# Patient Record
Sex: Male | Born: 1980 | Race: White | Hispanic: No | Marital: Married | Smoking: Former smoker
Health system: Southern US, Community
[De-identification: ages and names within clinical notes are randomized; demographics above are authoritative.]

## PROBLEM LIST (undated history)

## (undated) DIAGNOSIS — I10 Essential (primary) hypertension: Secondary | ICD-10-CM

## (undated) DIAGNOSIS — T7840XA Allergy, unspecified, initial encounter: Secondary | ICD-10-CM

## (undated) DIAGNOSIS — K219 Gastro-esophageal reflux disease without esophagitis: Secondary | ICD-10-CM

## (undated) DIAGNOSIS — F329 Major depressive disorder, single episode, unspecified: Secondary | ICD-10-CM

## (undated) DIAGNOSIS — F419 Anxiety disorder, unspecified: Secondary | ICD-10-CM

## (undated) DIAGNOSIS — F32A Depression, unspecified: Secondary | ICD-10-CM

## (undated) DIAGNOSIS — R7303 Prediabetes: Secondary | ICD-10-CM

## (undated) HISTORY — DX: Prediabetes: R73.03

## (undated) HISTORY — DX: Gastro-esophageal reflux disease without esophagitis: K21.9

## (undated) HISTORY — DX: Anxiety disorder, unspecified: F41.9

## (undated) HISTORY — DX: Allergy, unspecified, initial encounter: T78.40XA

## (undated) HISTORY — DX: Depression, unspecified: F32.A

## (undated) HISTORY — DX: Essential (primary) hypertension: I10

---
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
## 2010-11-29 ENCOUNTER — Emergency Department (HOSPITAL_COMMUNITY)
Admission: EM | Admit: 2010-11-29 | Discharge: 2010-11-29 | Disposition: A | Discharge status: Home / Self Care | Payer: BC Managed Care – PPO | Attending: Emergency Medicine | Admitting: Emergency Medicine

## 2010-11-29 DIAGNOSIS — R197 Diarrhea, unspecified: Secondary | ICD-10-CM | POA: Insufficient documentation

## 2010-11-29 DIAGNOSIS — R112 Nausea with vomiting, unspecified: Secondary | ICD-10-CM | POA: Insufficient documentation

## 2010-11-29 LAB — URINALYSIS, ROUTINE W REFLEX MICROSCOPIC
Glucose, UA: NEGATIVE mg/dL
Hgb urine dipstick: NEGATIVE
Specific Gravity, Urine: 1.02 (ref 1.005–1.030)
Urobilinogen, UA: 0.2 mg/dL (ref 0.0–1.0)
pH: 6 (ref 5.0–8.0)

## 2010-11-29 LAB — BASIC METABOLIC PANEL
CO2: 24 mEq/L (ref 19–32)
Calcium: 9.2 mg/dL (ref 8.4–10.5)
Chloride: 102 mEq/L (ref 96–112)
GFR calc Af Amer: >60 mL/min (ref >60)
Potassium: 3.5 mEq/L (ref 3.5–5.1)
Sodium: 136 mEq/L (ref 135–145)

## 2010-11-29 LAB — DIFFERENTIAL
Basophils Absolute: 0 10*3/uL (ref 0.0–0.1)
Basophils Relative: 0 % (ref 0–1)
Lymphocytes Relative: 15 % (ref 12–46)
Neutro Abs: 4.7 10*3/uL (ref 1.7–7.7)
Neutrophils Relative %: 73 % (ref 43–77)

## 2010-11-29 LAB — CBC
HCT: 49.6 % (ref 39.0–52.0)
Hemoglobin: 17.4 g/dL — ABNORMAL HIGH (ref 13.0–17.0)
RBC: 5.89 MIL/uL — ABNORMAL HIGH (ref 4.22–5.81)
WBC: 6.4 10*3/uL (ref 4.0–10.5)

## 2011-04-23 ENCOUNTER — Emergency Department (HOSPITAL_COMMUNITY)
Admission: EM | Admit: 2011-04-23 | Discharge: 2011-04-23 | Disposition: A | Discharge status: Home / Self Care | Payer: BC Managed Care – PPO | Attending: Emergency Medicine | Admitting: Emergency Medicine

## 2011-04-23 ENCOUNTER — Encounter: Payer: Self-pay | Admitting: *Deleted

## 2011-04-23 ENCOUNTER — Emergency Department (HOSPITAL_COMMUNITY): Payer: BC Managed Care – PPO

## 2011-04-23 DIAGNOSIS — J40 Bronchitis, not specified as acute or chronic: Secondary | ICD-10-CM | POA: Insufficient documentation

## 2011-04-23 DIAGNOSIS — F17200 Nicotine dependence, unspecified, uncomplicated: Secondary | ICD-10-CM | POA: Insufficient documentation

## 2011-04-23 MED ORDER — ALBUTEROL SULFATE HFA 108 (90 BASE) MCG/ACT IN AERS
2.0000 | INHALATION_SPRAY | Freq: Once | RESPIRATORY_TRACT | Status: AC
  Administered 2011-04-23: 2 via RESPIRATORY_TRACT

## 2011-04-23 NOTE — ED Provider Notes (Signed)
History     Chief Complaint  Patient presents with  . Cough   Patient is a 30 y.o. male presenting with cough. The history is provided by the patient. No language interpreter was used.  Cough This is a new problem. Episode onset: 3 days ago. The problem occurs constantly. The problem has not changed since onset.The cough is non-productive. There has been no fever. Associated symptoms include headaches and rhinorrhea. Pertinent negatives include no chest pain, no chills, no sore throat and no shortness of breath. He has tried nothing for the symptoms. He is a smoker.   C/o non-productive cough with associated HA, nasal/chest congestion and rhinorrhea onset 3 days ago and persistent since. Denies fever, hemoptysis, sore throat, chest pain, abdominal pain, SOB. Patient reports he is a current everyday smoker.  Patient seen at 9:45 AM   History reviewed. No pertinent past medical history.  History reviewed. No pertinent past surgical history.  No family history on file.  History  Substance Use Topics  . Smoking status: Current Everyday Smoker  . Smokeless tobacco: Not on file  . Alcohol Use: Yes     OCc      Review of Systems  Constitutional: Negative for fever and chills.  HENT: Positive for congestion and rhinorrhea. Negative for sore throat.   Respiratory: Positive for cough. Negative for shortness of breath.   Cardiovascular: Negative for chest pain.  Gastrointestinal: Negative for abdominal pain.  Skin: Negative for rash.  Neurological: Positive for headaches.  All other systems reviewed and are negative.   All other systems negative except as noted in HPI.   Physical Exam  BP 150/95  Pulse 89  Temp(Src) 98.9 F (37.2 C) (Oral)  Resp 16  Ht 5\' 10"  (1.778 m)  Wt 215 lb (97.523 kg)  BMI 30.85 kg/m2  SpO2 98%  Physical Exam CONSTITUTIONAL: Well developed/well nourished, hypertensive HEAD AND FACE: Normocephalic/atraumatic EYES: EOMI/PERRL ENMT: Mucous  membranes moist NECK: supple no meningeal signs CV: S1/S2 noted, no murmurs/rubs/gallops noted LUNGS: no apparent distress, scattered wheezes bialterally ABDOMEN: soft, nontender, no rebound or guarding NEURO: Pt is awake/alert, moves all extremitiesx4 EXTREMITIES: pulses normal, full ROM, DP and PT pulses intact SKIN: warm, color normal PSYCH: no abnormalities of mood noted  ED Course  Procedures 9:46 AM-Patient informed of intent to d/c Explained reasons to return to ED for evaluation in association with current complaint. MDM Nursing notes reviewed and considered in documentation xrays reviewed and considered All vitals reviewed and considered  Advised pt to quit smoking  Dg Chest 2 View  04/23/2011  *RADIOLOGY REPORT*  Clinical Data: Cough.  Congestion.  Smoking history.  CHEST - 2 VIEW  Comparison: None  Findings: Heart size is normal.  Mediastinal shadows are normal. The lungs are clear.  No infiltrate, collapse or effusion.  No bony abnormality.  IMPRESSION: Normal chest  Original Report Authenticated By: Thomasenia Sales, M.D.    Chart written by Clarita Crane acting as scribe for Joya Gaskins, MD  I personally performed the services described in this documentation, which was scribed in my presence. The recorded information has been reviewed and considered. Joya Gaskins, MD    Joya Gaskins, MD 04/23/11 [phone]

## 2011-04-23 NOTE — ED Notes (Signed)
Non-productive cough with congestion x 3 days. NAD at this time.

## 2011-04-23 NOTE — ED Notes (Signed)
Pt resting. Nad. Awaiting edp eval. Water given.

## 2011-04-23 NOTE — ED Notes (Signed)
edp in with pt now 

## 2014-12-03 ENCOUNTER — Encounter (HOSPITAL_COMMUNITY): Payer: Self-pay | Admitting: Emergency Medicine

## 2014-12-03 ENCOUNTER — Emergency Department (HOSPITAL_COMMUNITY)
Admission: EM | Admit: 2014-12-03 | Discharge: 2014-12-03 | Disposition: A | Discharge status: Home / Self Care | Payer: BLUE CROSS/BLUE SHIELD | Attending: Emergency Medicine | Admitting: Emergency Medicine

## 2014-12-03 DIAGNOSIS — R112 Nausea with vomiting, unspecified: Secondary | ICD-10-CM | POA: Diagnosis not present

## 2014-12-03 DIAGNOSIS — Z720 Tobacco use: Secondary | ICD-10-CM | POA: Diagnosis not present

## 2014-12-03 DIAGNOSIS — R1010 Upper abdominal pain, unspecified: Secondary | ICD-10-CM | POA: Diagnosis present

## 2014-12-03 DIAGNOSIS — R1013 Epigastric pain: Secondary | ICD-10-CM | POA: Diagnosis not present

## 2014-12-03 LAB — COMPREHENSIVE METABOLIC PANEL
ALT: 50 U/L (ref 0–53)
AST: 37 U/L (ref 0–37)
Albumin: 4.7 g/dL (ref 3.5–5.2)
Alkaline Phosphatase: 96 U/L (ref 39–117)
Anion gap: 11 (ref 5–15)
BUN: 15 mg/dL (ref 6–23)
CALCIUM: 10 mg/dL (ref 8.4–10.5)
CO2: 25 mmol/L (ref 19–32)
Chloride: 103 mmol/L (ref 96–112)
Creatinine, Ser: 1.05 mg/dL (ref 0.50–1.35)
GFR calc non Af Amer: >90 mL/min (ref >90)
GLUCOSE: 96 mg/dL (ref 70–99)
Potassium: 4.2 mmol/L (ref 3.5–5.1)
SODIUM: 139 mmol/L (ref 135–145)
TOTAL PROTEIN: 7.9 g/dL (ref 6.0–8.3)
Total Bilirubin: 0.6 mg/dL (ref 0.3–1.2)

## 2014-12-03 LAB — CBC WITH DIFFERENTIAL/PLATELET
BASOS ABS: 0.1 10*3/uL (ref 0.0–0.1)
BASOS PCT: 1 % (ref 0–1)
Eosinophils Absolute: 0.2 10*3/uL (ref 0.0–0.7)
Eosinophils Relative: 2 % (ref 0–5)
HCT: 52.9 % — ABNORMAL HIGH (ref 39.0–52.0)
Hemoglobin: 18.6 g/dL — ABNORMAL HIGH (ref 13.0–17.0)
LYMPHS PCT: 29 % (ref 12–46)
Lymphs Abs: 3.6 10*3/uL (ref 0.7–4.0)
MCH: 32.1 pg (ref 26.0–34.0)
MCHC: 35.2 g/dL (ref 30.0–36.0)
MCV: 91.2 fL (ref 78.0–100.0)
Monocytes Absolute: 0.9 10*3/uL (ref 0.1–1.0)
Monocytes Relative: 7 % (ref 3–12)
NEUTROS ABS: 7.6 10*3/uL (ref 1.7–7.7)
NEUTROS PCT: 61 % (ref 43–77)
PLATELETS: 215 10*3/uL (ref 150–400)
RBC: 5.8 MIL/uL (ref 4.22–5.81)
RDW: 13.2 % (ref 11.5–15.5)
WBC: 12.4 10*3/uL — AB (ref 4.0–10.5)

## 2014-12-03 LAB — LIPASE, BLOOD: Lipase: 21 U/L (ref 11–59)

## 2014-12-03 MED ORDER — SODIUM CHLORIDE 0.9 % IV BOLUS (SEPSIS)
1000.0000 mL | Freq: Once | INTRAVENOUS | Status: AC
  Administered 2014-12-03: 1000 mL via INTRAVENOUS

## 2014-12-03 MED ORDER — PANTOPRAZOLE SODIUM 40 MG PO TBEC
40.0000 mg | DELAYED_RELEASE_TABLET | Freq: Every day | ORAL | Status: DC
  Administered 2014-12-03: 40 mg via ORAL
  Filled 2014-12-03: qty 1

## 2014-12-03 MED ORDER — ONDANSETRON HCL 4 MG/2ML IJ SOLN
4.0000 mg | Freq: Once | INTRAMUSCULAR | Status: AC
  Administered 2014-12-03: 4 mg via INTRAVENOUS
  Filled 2014-12-03: qty 2

## 2014-12-03 MED ORDER — HYDROMORPHONE HCL 1 MG/ML IJ SOLN
1.0000 mg | Freq: Once | INTRAMUSCULAR | Status: AC
  Administered 2014-12-03: 1 mg via INTRAVENOUS
  Filled 2014-12-03: qty 1

## 2014-12-03 MED ORDER — PANTOPRAZOLE SODIUM 40 MG PO TBEC: 40.0000 mg | DELAYED_RELEASE_TABLET | Freq: Every day | ORAL | Status: DC

## 2014-12-03 MED ORDER — SUCRALFATE 1 GM/10ML PO SUSP
1.0000 g | Freq: Three times a day (TID) | ORAL | Status: DC
  Administered 2014-12-03: 1 g via ORAL
  Filled 2014-12-03: qty 10

## 2014-12-03 MED ORDER — SUCRALFATE 1 GM/10ML PO SUSP: 1.0000 g | Freq: Three times a day (TID) | ORAL | Status: DC

## 2014-12-03 NOTE — Discharge Instructions (Signed)
As discussed, your evaluation today has been largely reassuring.  But, it is important that you monitor your condition carefully, and do not hesitate to return to the ED if you develop new, or concerning changes in your condition.  Please drink alcohol only in moderation.  Otherwise, please follow-up with your physician for appropriate ongoing care.

## 2014-12-03 NOTE — ED Notes (Signed)
Pt reports heartburn and nausea, onset last night.

## 2014-12-03 NOTE — ED Provider Notes (Signed)
CSN: [id number]     Arrival date & time 12/03/14  1635 History  This chart was scribed for Hector Munchobert Isatou Agredano, MD by Hector Jackson, ED Scribe. This patient was seen in room APA05/APA05 and the patient's care was started 6:53 PM.    Chief Complaint  Patient presents with  . Abdominal Pain   The history is provided by the patient. No language interpreter was used.   HPI Comments: Hector Jackson is a 34 y.o. male who presents to the Emergency Department complaining of intermittent, upper abdominal pain that started last night. He states that the pain occasionally radiates upwards making him have to burp or vomit. Pt reports associated nausea and vomiting. He says he has tried taking tums which failed to provide relief to his symptoms. Pt states he has had similar prior episodes but never as severe as his current episode. Pt reports he drinks about 12 beers a day. He reports a history of smoking. Pt reports no pertinent past medical history at this time. He denies fever, any urinary symptoms or any change in bowel movements.    History reviewed. No pertinent past medical history. History reviewed. No pertinent past surgical history. Family History  Problem Relation Age of Onset  . Epilepsy Other   . Diabetes Other    History  Substance Use Topics  . Smoking status: Current Every Day Smoker -- 1.50 packs/day    Types: Cigarettes  . Smokeless tobacco: Never Used  . Alcohol Use: 36.0 oz/week    60 Cans of beer per week     Comment: 8-12 beers a day    Review of Systems  Constitutional:       Per HPI, otherwise negative  HENT:       Per HPI, otherwise negative  Respiratory:       Per HPI, otherwise negative  Cardiovascular:       Per HPI, otherwise negative  Gastrointestinal: Positive for nausea, vomiting and abdominal pain.  Endocrine:       Negative aside from HPI  Genitourinary:       Neg aside from HPI   Musculoskeletal:       Per HPI, otherwise negative  Skin: Negative.    Neurological: Negative for syncope.      Allergies  Review of patient's allergies indicates no known allergies.  Home Medications   Prior to Admission medications   Medication Sig Start Date End Date Taking? Authorizing Provider  acetaminophen (TYLENOL) 500 MG tablet Take 1,000 mg by mouth every 6 (six) hours as needed. For pain     Historical Provider, MD   BP 140/94 mmHg  Pulse 91  Temp(Src) 99.2 F (37.3 C) (Oral)  Resp 16  Ht 5\' 10"  (1.778 m)  Wt 220 lb (99.791 kg)  BMI 31.57 kg/m2  SpO2 100% Physical Exam  Constitutional: He is oriented to person, place, and time. He appears well-developed. No distress.  HENT:  Head: Normocephalic and atraumatic.  Eyes: Conjunctivae and EOM are normal.  Cardiovascular: Normal rate, regular rhythm and normal heart sounds.   Pulmonary/Chest: Effort normal and breath sounds normal. No stridor. No respiratory distress. He has no wheezes. He has no rales.  Abdominal: Soft. He exhibits no distension. There is no tenderness.  Musculoskeletal: He exhibits no edema.  Neurological: He is alert and oriented to person, place, and time.  Skin: Skin is warm and dry.  Psychiatric: He has a normal mood and affect.  Nursing note and vitals reviewed.   ED  Course  Procedures   DIAGNOSTIC STUDIES: Oxygen Saturation is 100% on RA, normal by my interpretation.    COORDINATION OF CARE: 6:58 PM Discussed treatment plan with pt at bedside and pt agreed to plan.   Labs Review Labs Reviewed  CBC WITH DIFFERENTIAL/PLATELET - Abnormal; Notable for the following:    WBC 12.4 (*)    Hemoglobin 18.6 (*)    HCT 52.9 (*)    All other components within normal limits  COMPREHENSIVE METABOLIC PANEL  LIPASE, BLOOD   8:01 PM Patient in no distress.  He states he has no pain. MDM   I personally performed the services described in this documentation, which was scribed in my presence. The recorded information has been reviewed and is accurate.   Patient  presents with ongoing epigastric pain. Patient is awake, alert, with resolution of pain following Carafate, Protonix, fluids. Patient is young, has no chest pain, no lightheadedness, other stigmata of ongoing coronary ischemia. Patient has no evidence for pulmonary embolus. Patient is a soft, nontender abdomen. Patient's history, physical exam are consistent with gastritis, likely alcohol induced. Patient was provided medication, discharged in stable condition to follow-up with GI as needed.  Hector Munch, MD 12/03/14 2002

## 2018-11-08 ENCOUNTER — Ambulatory Visit: Payer: Self-pay | Admitting: Psychiatry

## 2018-11-20 ENCOUNTER — Other Ambulatory Visit: Payer: Self-pay

## 2018-11-20 ENCOUNTER — Telehealth: Payer: Self-pay | Admitting: Psychiatry

## 2018-11-20 MED ORDER — LAMOTRIGINE 150 MG PO TABS: 150.0000 mg | ORAL_TABLET | Freq: Every day | ORAL | 0 refills | Status: DC

## 2018-11-20 NOTE — Telephone Encounter (Signed)
1 month refill Lamictal 150mg  1 daily submitted to [facility] per request

## 2018-11-20 NOTE — Telephone Encounter (Signed)
Need to review paper chart not seen in epic yet  

## 2018-11-20 NOTE — Telephone Encounter (Signed)
[facility] called to request refill for Lamictal.  Please send to [facility] in [place].  Next appt is 12/04/18

## 2018-12-04 ENCOUNTER — Ambulatory Visit: Payer: Self-pay | Admitting: Psychiatry

## 2019-01-19 ENCOUNTER — Other Ambulatory Visit: Payer: Self-pay

## 2019-01-19 ENCOUNTER — Ambulatory Visit (INDEPENDENT_AMBULATORY_CARE_PROVIDER_SITE_OTHER): Payer: 59 | Admitting: Psychiatry

## 2019-01-19 DIAGNOSIS — F99 Mental disorder, not otherwise specified: Secondary | ICD-10-CM | POA: Diagnosis not present

## 2019-01-19 DIAGNOSIS — F3160 Bipolar disorder, current episode mixed, unspecified: Secondary | ICD-10-CM | POA: Diagnosis not present

## 2019-01-19 DIAGNOSIS — F5105 Insomnia due to other mental disorder: Secondary | ICD-10-CM | POA: Diagnosis not present

## 2019-01-19 MED ORDER — LAMOTRIGINE 150 MG PO TABS
150.0000 mg | ORAL_TABLET | Freq: Every day | ORAL | 0 refills | Status: DC
Start: 2019-01-19 — End: 2019-01-22

## 2019-01-19 MED ORDER — OXCARBAZEPINE 300 MG PO TABS: 900.0000 mg | ORAL_TABLET | Freq: Every day | ORAL | 2 refills | Status: DC

## 2019-01-19 MED ORDER — ARIPIPRAZOLE 10 MG PO TABS: ORAL_TABLET | ORAL | 0 refills | Status: DC

## 2019-01-19 NOTE — Progress Notes (Signed)
Hector Jackson [id number] 11/08/1980 38 y.o.  Virtual Visit via Video Note  I connected with Hector Jackson on 01/19/19 at 11:00 AM EDT by a video enabled telemedicine application and verified that I am speaking with the correct person using two identifiers.   I discussed the limitations of evaluation and management by telemedicine and the availability of in person appointments. The patient expressed understanding and agreed to proceed.   I discussed the assessment and treatment plan with the patient. The patient was provided an opportunity to ask questions and all were answered. The patient agreed with the plan and demonstrated an understanding of the instructions.   The patient was advised to call back or seek an in-person evaluation if the symptoms worsen or if the condition fails to improve as anticipated.  I provided 30 minutes of non-face-to-face time during this encounter.  The patient was located at home.  The provider was located at home.   Hector Jackson, PMHNP    Subjective:   Patient ID:  Hector Jackson is a 38 y.o. (DOB [birth year]) male.  Chief Complaint: No chief complaint on file.   HPI Hector Jackson presents for follow-up of mood and sleep disturbance. Reports that he has not been able to afford Latuda and has not taken it since October. He reports that he is staying up "all night and then I can't sleep during the day." He reports that he has had altered sleep wake cycle since January. He reports that his mood has been "ok." He reports episodic depression- "it comes and goes... sometimes I will get depressed and then I will get to doing a project." He reports that his energy is elevated, he has an increase in goal-directed activity, and decreased sleep. He reports that these episodes occur weekly and last about 3-4 days. Reports that during depressed times energy and motivation is very low. Reports eating once during the day and is getting up at night. He denies  difficulty with concentration and focus. Denies AH or VH. Reports possible occasional mild paranoia. He reports that he has had some SI since last visit, "but none recently."   Reports that he used an air purifier and it has helped with his breathing at night and is waking up less frequently during the night.   Remains out of work.   Review of Systems:  Review of Systems  Medications: I have reviewed the patient's current medications.  Current Outpatient Medications  Medication Sig Dispense Refill  . atenolol (TENORMIN) 50 MG tablet     . lamoTRIgine (LAMICTAL) 150 MG tablet Take 1 tablet (150 mg total) by mouth daily. 30 tablet 0  . Oxcarbazepine (TRILEPTAL) 300 MG tablet Take 3 tablets (900 mg total) by mouth at bedtime for 30 days. 90 tablet 2  . ARIPiprazole (ABILIFY) 10 MG tablet Take 1/2 tab po qd x 7 days, then increase to 1 tab po qd 30 tablet 0   No current facility-administered medications for this visit.     Medication Side Effects: Other: Reports mouth and lips will feel numb immediately after taking medication.   Allergies: No Known Allergies  No past medical history on file.  Family History  Problem Relation Age of Onset  . Epilepsy Other   . Diabetes Other     Social History   Socioeconomic History  . Marital status: Single    Spouse name: Not on file  . Number of children: Not on file  . Years of education: Not  on file  . Highest education level: Not on file  Occupational History  . Not on file  Social Needs  . Financial resource strain: Not on file  . Food insecurity:    Worry: Not on file    Inability: Not on file  . Transportation needs:    Medical: Not on file    Non-medical: Not on file  Tobacco Use  . Smoking status: Current Every Day Smoker    Packs/day: 1.50    Types: Cigarettes  . Smokeless tobacco: Never Used  Substance and Sexual Activity  . Alcohol use: Yes    Alcohol/week: 60.0 standard drinks    Types: 60 Cans of beer per week     Comment: 8-12 beers a day  . Drug use: No  . Sexual activity: Not on file  Lifestyle  . Physical activity:    Days per week: Not on file    Minutes per session: Not on file  . Stress: Not on file  Relationships  . Social connections:    Talks on phone: Not on file    Gets together: Not on file    Attends religious service: Not on file    Active member of club or organization: Not on file    Attends meetings of clubs or organizations: Not on file    Relationship status: Not on file  . Intimate partner violence:    Fear of current or ex partner: Not on file    Emotionally abused: Not on file    Physically abused: Not on file    Forced sexual activity: Not on file  Other Topics Concern  . Not on file  Social History Narrative  . Not on file    Past Medical History, Surgical history, Social history, and Family history were reviewed and updated as appropriate.   Please see review of systems for further details on the patient's review from today.   Objective:   Physical Exam:  There were no vitals taken for this visit.  Physical Exam Neurological:     Mental Status: He is alert and oriented to person, place, and time.     Cranial Nerves: No dysarthria.  Psychiatric:        Attention and Perception: Attention normal.        Speech: Speech normal.        Behavior: Behavior is cooperative.        Thought Content: Thought content normal. Thought content is not paranoid or delusional. Thought content does not include homicidal or suicidal ideation. Thought content does not include homicidal or suicidal plan.        Cognition and Memory: Cognition and memory normal.        Judgment: Judgment normal.     Comments: Mood presents as mildly depressed.     Lab Review:     Component Value Date/Time   NA 139 12/03/2014 1829   K 4.2 12/03/2014 1829   CL 103 12/03/2014 1829   CO2 25 12/03/2014 1829   GLUCOSE 96 12/03/2014 1829   BUN 15 12/03/2014 1829   CREATININE 1.05  12/03/2014 1829   CALCIUM 10.0 12/03/2014 1829   PROT 7.9 12/03/2014 1829   ALBUMIN 4.7 12/03/2014 1829   AST 37 12/03/2014 1829   ALT 50 12/03/2014 1829   ALKPHOS 96 12/03/2014 1829   BILITOT 0.6 12/03/2014 1829   GFRNONAA >90 12/03/2014 1829   GFRAA >90 12/03/2014 1829       Component Value Date/Time  WBC 12.4 (H) 12/03/2014 1829   RBC 5.80 12/03/2014 1829   HGB 18.6 (H) 12/03/2014 1829   HCT 52.9 (H) 12/03/2014 1829   PLT 215 12/03/2014 1829   MCV 91.2 12/03/2014 1829   MCH 32.1 12/03/2014 1829   MCHC 35.2 12/03/2014 1829   RDW 13.2 12/03/2014 1829   LYMPHSABS 3.6 12/03/2014 1829   MONOABS 0.9 12/03/2014 1829   EOSABS 0.2 12/03/2014 1829   BASOSABS 0.1 12/03/2014 1829    No results found for: POCLITH, LITHIUM   No results found for: PHENYTOIN, PHENOBARB, VALPROATE, CBMZ   .res Assessment: Plan:   Discussed treatment plan with patient and he reports that Latuda was very effective for his mood signs and symptoms and was well-tolerated, however Latuda remains cost prohibitive and patient reports contacting pharmaceutical company and learning that additional assistance is not available.  Discussed alternatives to Latuda to include Abilify since it is a generic atypical antipsychotic and may have similar response to [place] and be more affordable. Discussed potential benefits, risks, and side effects of Abilify. Discussed potential metabolic side effects associated with atypical antipsychotics, as well as potential risk for movement side effects. Advised pt to contact office if movement side effects occur.  Will start Abilify 10 mg one half tab daily for 1 week, then increase to 10 mg daily to improve depression and for mood stabilization. Continue Lamictal 150 mg daily for mood signs and symptoms. Continue Trileptal 900 mg p.o. nightly for insomnia and mood stabilization. Discussed decreasing frequency of follow-up visits due to financial constraints.  Will schedule follow-up  visit in 3 months and patient advised to call office in 4 weeks with update regarding response and tolerability of Abilify.  Discussed scheduling earlier follow-up appointment if he has significant tolerability issues or worsening in mood signs and symptoms. Patient advised to contact office with any questions, adverse effects, or acute worsening in s/s.  Bipolar affective disorder, current episode mixed, current episode severity unspecified (HCC) - Plan: ARIPiprazole (ABILIFY) 10 MG tablet, lamoTRIgine (LAMICTAL) 150 MG tablet, Oxcarbazepine (TRILEPTAL) 300 MG tablet  Insomnia due to other mental disorder  Please see After Visit Summary for patient specific instructions.  Future Appointments  Date Time Provider Department Center  04/18/2019  1:45 PM Hector Jackson, PMHNP [facility] None    No orders of the defined types were placed in this encounter.     -------------------------------

## 2019-01-21 ENCOUNTER — Encounter: Payer: Self-pay | Admitting: Psychiatry

## 2019-01-22 ENCOUNTER — Other Ambulatory Visit: Payer: Self-pay

## 2019-01-22 ENCOUNTER — Telehealth: Payer: Self-pay

## 2019-01-22 ENCOUNTER — Telehealth: Payer: Self-pay | Admitting: Psychiatry

## 2019-01-22 DIAGNOSIS — F3160 Bipolar disorder, current episode mixed, unspecified: Secondary | ICD-10-CM

## 2019-01-22 MED ORDER — LAMOTRIGINE 150 MG PO TABS: 150.0000 mg | ORAL_TABLET | Freq: Every day | ORAL | 0 refills | Status: DC

## 2019-01-22 MED ORDER — ARIPIPRAZOLE 10 MG PO TABS: ORAL_TABLET | ORAL | 0 refills | Status: DC

## 2019-01-22 MED ORDER — OXCARBAZEPINE 300 MG PO TABS: 900.0000 mg | ORAL_TABLET | Freq: Every day | ORAL | 0 refills | Status: DC

## 2019-01-22 NOTE — Telephone Encounter (Signed)
Patient's spouse Nehemiah Settle indicated medication for Lamotrigine,Oxcarbavepine, Aripirazole to be sent via mail order [facility] per insurance, pt doesn't have any on file at [facility] and the Aripirazole needs a Prior [place], [phone])

## 2019-01-22 NOTE — Telephone Encounter (Signed)
Refills submitted. PA will be submitted

## 2019-01-22 NOTE — Telephone Encounter (Signed)
Prior authorization submitted for Aripiprazole 10 mgthrough [facility] approved effective 01/16/2019-01/22/2020

## 2019-02-20 ENCOUNTER — Encounter (INDEPENDENT_AMBULATORY_CARE_PROVIDER_SITE_OTHER): Payer: Self-pay

## 2019-02-20 ENCOUNTER — Encounter: Payer: Self-pay | Admitting: General Surgery

## 2019-02-20 ENCOUNTER — Other Ambulatory Visit: Payer: Self-pay

## 2019-02-20 ENCOUNTER — Ambulatory Visit (INDEPENDENT_AMBULATORY_CARE_PROVIDER_SITE_OTHER): Payer: 59 | Admitting: General Surgery

## 2019-02-20 VITALS — BP 149/97 | HR 87 | Temp 98.4°F | Resp 18 | Ht 71.0 in | Wt 281.0 lb

## 2019-02-20 DIAGNOSIS — K429 Umbilical hernia without obstruction or gangrene: Secondary | ICD-10-CM

## 2019-02-20 NOTE — Patient Instructions (Signed)
Ventral Hernia    A ventral hernia is a bulge of tissue from inside the abdomen that pushes through a weak area of the muscles that form the front wall of the abdomen. The tissues inside the abdomen are inside a sac (peritoneum). These tissues include the small intestine, large intestine, and the fatty tissue that covers the intestines (omentum). Sometimes, the bulge that forms a hernia contains intestines. Other hernias contain only fat. Ventral hernias do not go away without surgical treatment.  There are several types of ventral hernias. You may have:   A hernia at an incision site from previous abdominal surgery (incisional hernia).   A hernia just above the belly button (epigastric hernia), or at the belly button (umbilical hernia). These types of hernias can develop from heavy lifting or straining.   A hernia that comes and goes (reducible hernia). It may be visible only when you lift or strain. This type of hernia can be pushed back into the abdomen (reduced).   A hernia that traps abdominal tissue inside the hernia (incarcerated hernia). This type of hernia does not reduce.   A hernia that cuts off blood flow to the tissues inside the hernia (strangulated hernia). The tissues can start to die if this happens. This is a very painful bulge that cannot be reduced. A strangulated hernia is a medical emergency.  What are the causes?  This condition is caused by abdominal tissue putting pressure on an area of weakness in the abdominal muscles.  What increases the risk?  The following factors may make you more likely to develop this condition:   Being male.   Being 60 or older.   Being overweight or obese.   Having had previous abdominal surgery, especially if there was an infection after surgery.   Having had an injury to the abdominal wall.   Having had several pregnancies.   Having a buildup of fluid inside the abdomen (ascites).  What are the signs or symptoms?  The only symptom of a ventral hernia  may be a painless bulge in the abdomen. A reducible hernia may be visible only when you strain, cough, or lift. Other symptoms may include:   Dull pain.   A feeling of pressure.  Signs and symptoms of a strangulated hernia may include:   Increasing pain.   Nausea and vomiting.   Pain when pressing on the hernia.   The skin over the hernia turning red or purple.   Constipation.   Blood in the stool (feces).  How is this diagnosed?  This condition may be diagnosed based on:   Your symptoms.   Your medical history.   A physical exam. You may be asked to cough or strain while standing. These actions increase the pressure inside your abdomen and force the hernia through the opening in your muscles. Your health care provider may try to reduce the hernia by pressing on it.   Imaging studies, such as an ultrasound or CT scan.  How is this treated?  This condition is treated with surgery. If you have a strangulated hernia, surgery is done as soon as possible. If your hernia is small and not incarcerated, you may be asked to lose some weight before surgery.  Follow these instructions at home:   Follow instructions from your health care provider about eating or drinking restrictions.   If you are overweight, your health care provider may recommend that you increase your activity level and eat a healthier diet.     Do not lift anything that is heavier than 10 lb (4.5 kg).   Return to your normal activities as told by your health care provider. Ask your health care provider what activities are safe for you. You may need to avoid activities that increase pressure on your hernia.   Take over-the-counter and prescription medicines only as told by your health care provider.   Keep all follow-up visits as told by your health care provider. This is important.  Contact a health care provider if:   Your hernia gets larger.   Your hernia becomes painful.  Get help right away if:   Your hernia becomes increasingly  painful.   You have pain along with any of the following:  ? Changes in skin color in the area of the hernia.  ? Nausea.  ? Vomiting.  ? Fever.  Summary   A ventral hernia is a bulge of tissue from inside the abdomen that pushes through a weak area of the muscles that form the front wall of the abdomen.   This condition is treated with surgery, which may be urgent depending on your hernia.   Do not lift anything that is heavier than 10 lb (4.5 kg), and follow activity instructions from your health care provider.  This information is not intended to replace advice given to you by your health care provider. Make sure you discuss any questions you have with your health care provider.  Document Released: 08/30/2012 Document Revised: 10/26/2017 Document Reviewed: 04/04/2017  [employer]vier Interactive Patient Education  2019 [employer]

## 2019-02-21 NOTE — Progress Notes (Signed)
Hector Jackson; [id number]; 07-30-1981   HPI Patient is a 38 year old white male who was referred to my care by Hector Jackson for evaluation treatment of an umbilical hernia.  Patient states he has had an umbilical hernia for some time now, slightly increased in size over the past few months.  He states he has intermittent upper abdominal pain when he is lifting something heavy.  It is not affected his work at this time.  He denies any nausea or vomiting.  He currently has 0 out of 10 pain. Past Medical History:  Diagnosis Date  . Allergy   . Anxiety   . Depression   . GERD (gastroesophageal reflux disease)   . Hypertension     History reviewed. No pertinent surgical history.  Family History  Problem Relation Age of Onset  . Epilepsy Other   . Diabetes Other     Current Outpatient Medications on File Prior to Visit  Medication Sig Dispense Refill  . ARIPiprazole (ABILIFY) 10 MG tablet Take 1/2 tab po qd x 7 days, then increase to 1 tab po qd 90 tablet 0  . cetirizine (ZYRTEC) 10 MG tablet Take 10 mg by mouth daily.    Marland Kitchen. lamoTRIgine (LAMICTAL) 150 MG tablet Take 1 tablet (150 mg total) by mouth daily. 90 tablet 0  . Oxcarbazepine (TRILEPTAL) 300 MG tablet Take 3 tablets (900 mg total) by mouth at bedtime. 270 tablet 0  . atenolol (TENORMIN) 50 MG tablet      No current facility-administered medications on file prior to visit.     No Known Allergies  Social History   Substance and Sexual Activity  Alcohol Use Yes  . Alcohol/week: 60.0 standard drinks  . Types: 60 Cans of beer per week   Comment: 8-12 beers a day    Social History   Tobacco Use  Smoking Status Former Smoker  . Packs/day: 1.50  . Types: Cigarettes  . Last attempt to quit: 06/22/2016  . Years since quitting: 2.6  Smokeless Tobacco Never Used    Review of Systems  Constitutional: Positive for malaise/fatigue.  HENT: Positive for sinus pain.   Eyes: Negative.   Respiratory: Negative.    Cardiovascular: Negative.   Gastrointestinal: Positive for abdominal pain and nausea.  Genitourinary: Negative.   Musculoskeletal: Positive for back pain, joint pain and neck pain.  Skin: Negative.   Neurological: Positive for dizziness.  Endo/Heme/Allergies: Negative.   Psychiatric/Behavioral: Negative.     Objective   Vitals:   02/20/19 1339  BP: (!) 149/97  Pulse: 87  Resp: 18  Temp: 98.4 F (36.9 C)  SpO2: 95%    Physical Exam Vitals signs reviewed.  Constitutional:      Appearance: Normal appearance. He is obese. He is not ill-appearing.  HENT:     Head: Normocephalic and atraumatic.  Cardiovascular:     Rate and Rhythm: Normal rate and regular rhythm.     Heart sounds: Normal heart sounds. No murmur. No friction rub. No gallop.   Pulmonary:     Effort: Pulmonary effort is normal. No respiratory distress.     Breath sounds: Normal breath sounds. No stridor. No wheezing, rhonchi or rales.  Abdominal:     General: Abdomen is flat. Bowel sounds are normal. There is no distension.     Palpations: Abdomen is soft. There is no mass.     Tenderness: There is no abdominal tenderness. There is no guarding or rebound.     Hernia: A hernia is present.  Comments: Reducible umbilical hernia.  Skin:    General: Skin is warm and dry.  Neurological:     Mental Status: He is alert and oriented to person, place, and time.   Primary care notes reviewed  Assessment  Umbilical hernia Plan   As patient is not significantly symptomatic from his umbilical hernia, he would like to delay any surgical intervention at this time which is fine with me.  Literature was given concerning incarceration.  He will follow-up if he desires repair.  The risks and benefits of the surgery including bleeding, infection, mesh use, the possibility of recurrence of the hernia were explained to the patient.

## 2019-03-10 ENCOUNTER — Other Ambulatory Visit: Payer: Self-pay | Admitting: Psychiatry

## 2019-03-10 DIAGNOSIS — F3160 Bipolar disorder, current episode mixed, unspecified: Secondary | ICD-10-CM

## 2019-04-06 ENCOUNTER — Other Ambulatory Visit: Payer: Self-pay | Admitting: Psychiatry

## 2019-04-06 DIAGNOSIS — F3160 Bipolar disorder, current episode mixed, unspecified: Secondary | ICD-10-CM

## 2019-04-09 ENCOUNTER — Other Ambulatory Visit: Payer: Self-pay | Admitting: Psychiatry

## 2019-04-09 DIAGNOSIS — F3160 Bipolar disorder, current episode mixed, unspecified: Secondary | ICD-10-CM

## 2019-04-09 NOTE — Telephone Encounter (Signed)
Pt left message need new Rx sent to [facility] for Lamictal

## 2019-04-18 ENCOUNTER — Ambulatory Visit: Payer: 59 | Admitting: Psychiatry

## 2019-05-01 ENCOUNTER — Encounter: Payer: Self-pay | Admitting: Psychiatry

## 2019-05-01 ENCOUNTER — Ambulatory Visit (INDEPENDENT_AMBULATORY_CARE_PROVIDER_SITE_OTHER): Payer: 59 | Admitting: Psychiatry

## 2019-05-01 ENCOUNTER — Other Ambulatory Visit: Payer: Self-pay

## 2019-05-01 DIAGNOSIS — F3160 Bipolar disorder, current episode mixed, unspecified: Secondary | ICD-10-CM

## 2019-05-01 MED ORDER — OXCARBAZEPINE 300 MG PO TABS: 900.0000 mg | ORAL_TABLET | Freq: Every day | ORAL | 0 refills | Status: DC

## 2019-05-01 MED ORDER — LAMOTRIGINE 150 MG PO TABS: 150.0000 mg | ORAL_TABLET | Freq: Every day | ORAL | 1 refills | Status: DC

## 2019-05-01 MED ORDER — ARIPIPRAZOLE 15 MG PO TABS: ORAL_TABLET | ORAL | 0 refills | Status: DC

## 2019-05-01 NOTE — Progress Notes (Signed)
   05/01/19 1046  Facial and Oral Movements  Muscles of Facial Expression 0  Lips and Perioral Area 0  Jaw 0  Tongue 0  Extremity Movements  Upper (arms, wrists, hands, fingers) 0  Lower (legs, knees, ankles, toes) 0  Trunk Movements  Neck, shoulders, hips 0  Overall Severity  Severity of abnormal movements (highest score from questions above) 0  Incapacitation due to abnormal movements 0  Patient's awareness of abnormal movements (rate only patient's report) 0  Dental Status  Current problems with teeth and/or dentures? No  Does patient usually wear dentures? No  AIMS Total Score  AIMS Total Score 0

## 2019-05-01 NOTE — Progress Notes (Signed)
Hector Jackson [id number] 1980-09-29 38 y.o.  Virtual Visit via Video Note  I connected with Hector Jackson on 05/01/19 at 10:30 AM EDT by a video enabled telemedicine application and verified that I am speaking with the correct person using two identifiers.  Location: Patient: Home Provider: [facility]   I discussed the limitations of evaluation and management by telemedicine and the availability of in person appointments. The patient expressed understanding and agreed to proceed.  History of Present Illness:  I discussed the assessment and treatment plan with the patient. The patient was provided an opportunity to ask questions and all were answered. The patient agreed with the plan and demonstrated an understanding of the instructions.   The patient was advised to call back or seek an in-person evaluation if the symptoms worsen or if the condition fails to improve as anticipated.  I provided 30 minutes of non-face-to-face time during this encounter.   Thayer Headings, PMHNP   Subjective:   Patient ID:  Hector Jackson is a 38 y.o. (DOB 04-30-1981) male.  Chief Complaint:  Chief Complaint  Patient presents with  . Depression  . Manic Behavior    HPI Hector Jackson presents to the office today for follow-up of mood and anxiety. He reports that he initially experienced some manic s/s with starting Abilify and had elevated mood, increased energy and goal-directed activity (building furniture and a deck). Reports some increased spending at that time on supplies for projects. Denies any other spending. Reports that he had decreased need for sleep. He reports that he was not able to start abilify for about one month due to having to change pharmacy to mail order due to cost. Reports that he also had to obtain prior authorization.   Reports that he had manic s/s for about a month. He reports that mood recently changed. He reports that his body is sore and attributes  this to increased activity. He reports that energy and motivation are now low. He reports that mood is "alright" and denies sad mood or irritability. He reports that he awakens at least 1-2 times a night. Reports that he was sleeping 5-6 hours a night previously and now is sleeping more and excessively at times. Typically eats about once a day and reports that appetite does not seem to change with mood. He reports concentration has been poor since mood has been more depressed. He reports that he tries to do things and then cannot complete it due to poor concentration. Denies anhedonia. Denies SI.   He reports that he has been having increased anxiety with situational stress with having a pool installed improperly. Reports occasional panic s/s and can focus on his breathing and prevent it from escalating to a full blown panic attack.     Review of Systems:  Review of Systems  Musculoskeletal: Negative for gait problem.  Neurological: Negative for tremors.  Psychiatric/Behavioral:       Please refer to HPI    Medications: I have reviewed the patient's current medications.  Current Outpatient Medications  Medication Sig Dispense Refill  . ARIPiprazole (ABILIFY) 15 MG tablet TAKE 1 TABLET  DAILY 90 tablet 0  . cetirizine (ZYRTEC) 10 MG tablet Take 10 mg by mouth daily.    Marland Kitchen lamoTRIgine (LAMICTAL) 150 MG tablet Take 1 tablet (150 mg total) by mouth daily. 90 tablet 1  . Multiple Vitamins-Minerals (ONE DAILY MULTIVITAMIN ADULT PO) Take by mouth.    . Oxcarbazepine (TRILEPTAL) 300 MG tablet Take  3 tablets (900 mg total) by mouth at bedtime. 270 tablet 0  . atenolol (TENORMIN) 50 MG tablet      No current facility-administered medications for this visit.     Medication Side Effects: None  Allergies: No Known Allergies  Past Medical History:  Diagnosis Date  . Allergy   . Anxiety   . Depression   . GERD (gastroesophageal reflux disease)   . Hypertension     Family History  Problem  Relation Age of Onset  . Epilepsy Other   . Diabetes Other     Social History   Socioeconomic History  . Marital status: Single    Spouse name: Not on file  . Number of children: Not on file  . Years of education: Not on file  . Highest education level: Not on file  Occupational History  . Not on file  Social Needs  . Financial resource strain: Not on file  . Food insecurity    Worry: Not on file    Inability: Not on file  . Transportation needs    Medical: Not on file    Non-medical: Not on file  Tobacco Use  . Smoking status: Former Smoker    Packs/day: 1.50    Types: Cigarettes    Quit date: 06/22/2016    Years since quitting: 2.8  . Smokeless tobacco: Never Used  Substance and Sexual Activity  . Alcohol use: Yes    Alcohol/week: 60.0 standard drinks    Types: 60 Cans of beer per week    Comment: 8-12 beers a day  . Drug use: No  . Sexual activity: Not on file  Lifestyle  . Physical activity    Days per week: Not on file    Minutes per session: Not on file  . Stress: Not on file  Relationships  . Social Musicianconnections    Talks on phone: Not on file    Gets together: Not on file    Attends religious service: Not on file    Active member of club or organization: Not on file    Attends meetings of clubs or organizations: Not on file    Relationship status: Not on file  . Intimate partner violence    Fear of current or ex partner: Not on file    Emotionally abused: Not on file    Physically abused: Not on file    Forced sexual activity: Not on file  Other Topics Concern  . Not on file  Social History Narrative  . Not on file    Past Medical History, Surgical history, Social history, and Family history were reviewed and updated as appropriate.   Please see review of systems for further details on the patient's review from today.   Objective:   Physical Exam:  There were no vitals taken for this visit.  Physical Exam Neurological:     Mental Status: He  is alert and oriented to person, place, and time.     Cranial Nerves: No dysarthria.  Psychiatric:        Attention and Perception: Attention normal.        Mood and Affect: Mood is depressed.        Speech: Speech normal.        Behavior: Behavior normal. Behavior is cooperative.        Thought Content: Thought content normal. Thought content is not paranoid or delusional. Thought content does not include homicidal or suicidal ideation. Thought content does not include homicidal  or suicidal plan.        Cognition and Memory: Cognition and memory normal.        Judgment: Judgment normal.     Lab Review:     Component Value Date/Time   NA 139 12/03/2014 1829   K 4.2 12/03/2014 1829   CL 103 12/03/2014 1829   CO2 25 12/03/2014 1829   GLUCOSE 96 12/03/2014 1829   BUN 15 12/03/2014 1829   CREATININE 1.05 12/03/2014 1829   CALCIUM 10.0 12/03/2014 1829   PROT 7.9 12/03/2014 1829   ALBUMIN 4.7 12/03/2014 1829   AST 37 12/03/2014 1829   ALT 50 12/03/2014 1829   ALKPHOS 96 12/03/2014 1829   BILITOT 0.6 12/03/2014 1829   GFRNONAA >90 12/03/2014 1829   GFRAA >90 12/03/2014 1829       Component Value Date/Time   WBC 12.4 (H) 12/03/2014 1829   RBC 5.80 12/03/2014 1829   HGB 18.6 (H) 12/03/2014 1829   HCT 52.9 (H) 12/03/2014 1829   PLT 215 12/03/2014 1829   MCV 91.2 12/03/2014 1829   MCH 32.1 12/03/2014 1829   MCHC 35.2 12/03/2014 1829   RDW 13.2 12/03/2014 1829   LYMPHSABS 3.6 12/03/2014 1829   MONOABS 0.9 12/03/2014 1829   EOSABS 0.2 12/03/2014 1829   BASOSABS 0.1 12/03/2014 1829    No results found for: POCLITH, LITHIUM   No results found for: PHENYTOIN, PHENOBARB, VALPROATE, CBMZ   .res Assessment: Plan:   Discussed potential benefits, risks, and side effects of increasing Abilify to 15 mg daily to further improve mood signs and symptoms since patient has been tolerating Abilify 10 mg daily without difficulty and this dose has been somewhat effective for his mood signs  and symptoms with less severe mood signs and symptoms and less mood lability.  Discussed that higher dose of Abilify may further improve mood symptoms since patient has had recent hypomanic signs and symptoms and is now reporting mild depressive signs and symptoms. We will continue Trileptal 900 mg at bedtime for mood stabilization and anxiety. We will continue Lamictal 150 mg daily for mood signs and symptoms. Patient to follow-up in 4 weeks or sooner if clinically indicated. Patient advised to contact office with any questions, adverse effects, or acute worsening in signs and symptoms.   Hector Jackson was seen today for depression and manic behavior.  Diagnoses and all orders for this visit:  Bipolar affective disorder, current episode mixed, current episode severity unspecified (HCC) -     ARIPiprazole (ABILIFY) 15 MG tablet; TAKE 1 TABLET  DAILY -     lamoTRIgine (LAMICTAL) 150 MG tablet; Take 1 tablet (150 mg total) by mouth daily. -     Oxcarbazepine (TRILEPTAL) 300 MG tablet; Take 3 tablets (900 mg total) by mouth at bedtime.     Please see After Visit Summary for patient specific instructions.  Future Appointments  Date Time Provider Department Center  07/02/2019  9:30 AM Corie Chiquitoarter, Travonna Swindle, PMHNP [facility] None    No orders of the defined types were placed in this encounter.   -------------------------------

## 2019-07-02 ENCOUNTER — Encounter: Payer: Self-pay | Admitting: Psychiatry

## 2019-07-02 ENCOUNTER — Ambulatory Visit (INDEPENDENT_AMBULATORY_CARE_PROVIDER_SITE_OTHER): Payer: 59 | Admitting: Psychiatry

## 2019-07-02 ENCOUNTER — Other Ambulatory Visit: Payer: Self-pay

## 2019-07-02 VITALS — Wt 300.0 lb

## 2019-07-02 DIAGNOSIS — F99 Mental disorder, not otherwise specified: Secondary | ICD-10-CM | POA: Diagnosis not present

## 2019-07-02 DIAGNOSIS — F5105 Insomnia due to other mental disorder: Secondary | ICD-10-CM

## 2019-07-02 DIAGNOSIS — F3160 Bipolar disorder, current episode mixed, unspecified: Secondary | ICD-10-CM

## 2019-07-02 NOTE — Progress Notes (Signed)
Hector Jackson [id number] 31-Jan-1981 38 y.o.  Virtual Visit via Telephone Note  I connected with pt on 07/02/19 at  9:30 AM EDT by telephone and verified that I am speaking with the correct person using two identifiers.   I discussed the limitations, risks, security and privacy concerns of performing an evaluation and management service by telephone and the availability of in person appointments. I also discussed with the patient that there may be a patient responsible charge related to this service. The patient expressed understanding and agreed to proceed.   I discussed the assessment and treatment plan with the patient. The patient was provided an opportunity to ask questions and all were answered. The patient agreed with the plan and demonstrated an understanding of the instructions.   The patient was advised to call back or seek an in-person evaluation if the symptoms worsen or if the condition fails to improve as anticipated.  I provided 25 minutes of non-face-to-face time during this encounter.  The patient was located at home.  The provider was located at [facility].   Corie Chiquito, PMHNP   Subjective:   Patient ID:  Hector Jackson is a 38 y.o. (DOB 03/22/1981) male.  Chief Complaint:  Chief Complaint  Patient presents with  . Depression  . Follow-up    h/o Mood instability and anxiety    HPI [name] presents for follow-up of mood and anxiety. He reports that his energy and motivation have been low- "just been laying around a lot." Denies depressed mood other than some situational sadness- "some days I feel kind of depressed and some days I don't." He reports that mood seems to fluctuate daily. He reports that his wife has noticed some depressive s/s. Denies irritability. Denies elevated mood. Brief periods of slightly improved energy and motivation. Denies excessive spending. He reports frequent worry and feeling nervous. He reports occ panic s/s  without progressing to a full blown panic attack.  He reports poor sleep and states he is "up and down all night long." Reports awakening every 2 hours during the night and then starts to sleep soundly in the morning. Altered sleep wake cycle and sleeping more in the day. Estimates sleeping 8-12 hours, fragmented in a 24-hour period. He reports that he has been eating more and has likely gained weight. Concentration has been poor. He reports that he will forget what he is doing in the middle of performing a task. Denies SI.  He continues to seek employment.   Has been taking Abilify in the morning or when he starts his day.   Past Psychiatric Medication Trials: Abilify- Had manic s/s at 10 mg dose. Worsening depression at 15 mg po qd Kasandra Knudsen- has been most effective med for him but current insurance offers limited coverage.  Trileptal Lamictal Buspar-Ineffective Naltrexone Trazodone  Review of Systems:  Review of Systems  Musculoskeletal: Negative for gait problem.  Neurological: Negative for tremors.  Psychiatric/Behavioral:       Please refer to HPI    Medications: I have reviewed the patient's current medications.  Current Outpatient Medications  Medication Sig Dispense Refill  . ARIPiprazole (ABILIFY) 15 MG tablet TAKE 1 TABLET  DAILY 90 tablet 0  . atenolol (TENORMIN) 50 MG tablet     . cetirizine (ZYRTEC) 10 MG tablet Take 10 mg by mouth daily.    Marland Kitchen lamoTRIgine (LAMICTAL) 150 MG tablet Take 1 tablet (150 mg total) by mouth daily. 90 tablet 1  . Multiple Vitamins-Minerals (ONE  DAILY MULTIVITAMIN ADULT PO) Take by mouth.    . Oxcarbazepine (TRILEPTAL) 300 MG tablet Take 3 tablets (900 mg total) by mouth at bedtime. 270 tablet 0   No current facility-administered medications for this visit.     Medication Side Effects: None  Allergies: No Known Allergies  Past Medical History:  Diagnosis Date  . Allergy   . Anxiety   . Depression   . GERD (gastroesophageal reflux  disease)   . Hypertension     Family History  Problem Relation Age of Onset  . Epilepsy Other   . Diabetes Other     Social History   Socioeconomic History  . Marital status: Single    Spouse name: Not on file  . Number of children: Not on file  . Years of education: Not on file  . Highest education level: Not on file  Occupational History  . Not on file  Social Needs  . Financial resource strain: Not on file  . Food insecurity    Worry: Not on file    Inability: Not on file  . Transportation needs    Medical: Not on file    Non-medical: Not on file  Tobacco Use  . Smoking status: Former Smoker    Packs/day: 1.50    Types: Cigarettes    Quit date: 06/22/2016    Years since quitting: 3.0  . Smokeless tobacco: Never Used  Substance and Sexual Activity  . Alcohol use: Yes    Alcohol/week: 60.0 standard drinks    Types: 60 Cans of beer per week    Comment: 8-12 beers a day  . Drug use: No  . Sexual activity: Not on file  Lifestyle  . Physical activity    Days per week: Not on file    Minutes per session: Not on file  . Stress: Not on file  Relationships  . Social Herbalist on phone: Not on file    Gets together: Not on file    Attends religious service: Not on file    Active member of club or organization: Not on file    Attends meetings of clubs or organizations: Not on file    Relationship status: Not on file  . Intimate partner violence    Fear of current or ex partner: Not on file    Emotionally abused: Not on file    Physically abused: Not on file    Forced sexual activity: Not on file  Other Topics Concern  . Not on file  Social History Narrative  . Not on file    Past Medical History, Surgical history, Social history, and Family history were reviewed and updated as appropriate.   Please see review of systems for further details on the patient's review from today.   Objective:   Physical Exam:  Wt 300 lb (136.1 kg)   BMI 41.84  kg/m   Physical Exam Neurological:     Mental Status: He is alert and oriented to person, place, and time.     Cranial Nerves: No dysarthria.  Psychiatric:        Attention and Perception: Attention normal.        Mood and Affect: Mood is depressed.        Speech: Speech normal.        Behavior: Behavior is cooperative.        Thought Content: Thought content normal. Thought content is not paranoid or delusional. Thought content does not include homicidal or  suicidal ideation. Thought content does not include homicidal or suicidal plan.        Cognition and Memory: Cognition and memory normal.        Judgment: Judgment normal.     Lab Review:     Component Value Date/Time   NA 139 12/03/2014 1829   K 4.2 12/03/2014 1829   CL 103 12/03/2014 1829   CO2 25 12/03/2014 1829   GLUCOSE 96 12/03/2014 1829   BUN 15 12/03/2014 1829   CREATININE 1.05 12/03/2014 1829   CALCIUM 10.0 12/03/2014 1829   PROT 7.9 12/03/2014 1829   ALBUMIN 4.7 12/03/2014 1829   AST 37 12/03/2014 1829   ALT 50 12/03/2014 1829   ALKPHOS 96 12/03/2014 1829   BILITOT 0.6 12/03/2014 1829   GFRNONAA >90 12/03/2014 1829   GFRAA >90 12/03/2014 1829       Component Value Date/Time   WBC 12.4 (H) 12/03/2014 1829   RBC 5.80 12/03/2014 1829   HGB 18.6 (H) 12/03/2014 1829   HCT 52.9 (H) 12/03/2014 1829   PLT 215 12/03/2014 1829   MCV 91.2 12/03/2014 1829   MCH 32.1 12/03/2014 1829   MCHC 35.2 12/03/2014 1829   RDW 13.2 12/03/2014 1829   LYMPHSABS 3.6 12/03/2014 1829   MONOABS 0.9 12/03/2014 1829   EOSABS 0.2 12/03/2014 1829   BASOSABS 0.1 12/03/2014 1829    No results found for: POCLITH, LITHIUM   No results found for: PHENYTOIN, PHENOBARB, VALPROATE, CBMZ   .res Assessment: Plan:   Discussed possible treatment options, to include changing Abilify to an alternative medication such as risperidone.  Discussed that Abilify may be causing some somnolence and another option would be to change administration  time of Abilify to bedtime to possibly improve sleep at night and minimize daytime somnolence.  Patient reports that he would prefer to continue Abilify at this time and change administration time.  May consider increase in Lamictal if change in administration time of Abilify does not improve depressive signs and symptoms. Patient to follow-up in approximately 2 months or sooner if clinically indicated. Patient advised to contact office with any questions, adverse effects, or acute worsening in signs and symptoms. Jill Alexanders.  Doyal was seen today for depression and follow-up.  Diagnoses and all orders for this visit:  Bipolar affective disorder, current episode mixed, current episode severity unspecified (HCC)  Insomnia due to other mental disorder    Please see After Visit Summary for patient specific instructions.  Future Appointments  Date Time Provider Department Center  09/10/2019  9:30 AM Corie Chiquitoarter, Kennya Schwenn, PMHNP [facility] None    No orders of the defined types were placed in this encounter.     -------------------------------

## 2019-07-12 ENCOUNTER — Other Ambulatory Visit: Payer: Self-pay | Admitting: Family Medicine

## 2019-07-12 ENCOUNTER — Other Ambulatory Visit: Payer: Self-pay

## 2019-07-12 ENCOUNTER — Ambulatory Visit: Payer: Self-pay

## 2019-07-12 DIAGNOSIS — Z0000 Encounter for general adult medical examination without abnormal findings: Secondary | ICD-10-CM

## 2019-08-14 ENCOUNTER — Other Ambulatory Visit: Payer: Self-pay | Admitting: Psychiatry

## 2019-08-14 DIAGNOSIS — F3160 Bipolar disorder, current episode mixed, unspecified: Secondary | ICD-10-CM

## 2019-09-05 ENCOUNTER — Telehealth: Payer: Self-pay | Admitting: Psychiatry

## 2019-09-05 NOTE — Telephone Encounter (Signed)
Hector Jackson called returning a call he had from here though He didn't know who called.  He said his insurance has changed and he now has to use [facility].  If you have received something from them, it is because scripts now need to go to them.  Though he said he didn't need any refill right now.

## 2019-09-05 NOTE — Telephone Encounter (Signed)
Noted thank you, yes received fax from [place] called to confirm with him.

## 2019-09-10 ENCOUNTER — Other Ambulatory Visit: Payer: Self-pay

## 2019-09-10 ENCOUNTER — Ambulatory Visit (INDEPENDENT_AMBULATORY_CARE_PROVIDER_SITE_OTHER): Payer: 59 | Admitting: Psychiatry

## 2019-09-10 ENCOUNTER — Encounter: Payer: Self-pay | Admitting: Psychiatry

## 2019-09-10 VITALS — Wt 300.0 lb

## 2019-09-10 DIAGNOSIS — F5105 Insomnia due to other mental disorder: Secondary | ICD-10-CM

## 2019-09-10 DIAGNOSIS — F99 Mental disorder, not otherwise specified: Secondary | ICD-10-CM

## 2019-09-10 DIAGNOSIS — F3160 Bipolar disorder, current episode mixed, unspecified: Secondary | ICD-10-CM

## 2019-09-10 MED ORDER — LURASIDONE HCL 40 MG PO TABS: ORAL_TABLET | ORAL | 0 refills | Status: DC

## 2019-09-10 MED ORDER — ARIPIPRAZOLE 15 MG PO TABS: ORAL_TABLET | ORAL | 3 refills | Status: DC

## 2019-09-10 MED ORDER — OXCARBAZEPINE 300 MG PO TABS: 900.0000 mg | ORAL_TABLET | Freq: Every day | ORAL | 0 refills | Status: DC

## 2019-09-10 MED ORDER — LAMOTRIGINE 150 MG PO TABS: 150.0000 mg | ORAL_TABLET | Freq: Every day | ORAL | 1 refills | Status: DC

## 2019-09-10 NOTE — Progress Notes (Signed)
[name] [id number] 12-24-1980 38 y.o.  Virtual Visit via Telephone Note  I connected with pt on 09/10/19 at  9:30 AM EST by telephone and verified that I am speaking with the correct person using two identifiers.   I discussed the limitations, risks, security and privacy concerns of performing an evaluation and management service by telephone and the availability of in person appointments. I also discussed with the patient that there may be a patient responsible charge related to this service. The patient expressed understanding and agreed to proceed.   I discussed the assessment and treatment plan with the patient. The patient was provided an opportunity to ask questions and all were answered. The patient agreed with the plan and demonstrated an understanding of the instructions.   The patient was advised to call back or seek an in-person evaluation if the symptoms worsen or if the condition fails to improve as anticipated.  I provided 25 minutes of non-face-to-face time during this encounter.  The patient was located at home.  The provider was located at [facility].   Hector Jackson, PMHNP   Subjective:   Patient ID:  Hector Jackson is a 38 y.o. (DOB 01-26-1981) male.  Chief Complaint:  Chief Complaint  Patient presents with  . Anxiety  . Follow-up    History of mood disturbance    HPI Hector Jackson presents for follow-up of mood and anxiety. He reports mood has been stable. Denies depressed mood or irritability. Denies any recent manic s/s to include excessive spending or increased goal-directed activity. He reports that the continues to have significant anxiety. He reports frequent worry "about everything." He reports frequent nausea with anxiety. He reports that he has had some panic s/s without any recent full blown panic attacks. He reports that he has been sleeping well and has been on a day shift and this is about to change. He reports that his energy  and motivation have been adequate. He notices mood and anxiety are better when he stays busy. He reports some difficulty with concentration at times. Denies SI.   He reports that he has a new job in [place]. He reports that job has been going well and will be going to night shift, although he would prefer day shift.  He reports that he has gained wt despite no significant change in food intake. Continues to eat only 2 meals qd without snacks.   He reports, "everything was better on Latuda." He reports that he has gained weight since starting Abilify. Weighed 250 lbs prior to starting medications. He reports that pre-employment physical indicated increased glucose and cholesterol.    Past Psychiatric Medication Trials: Abilify- Had manic s/s at 10 mg dose. Worsening depression at 15 mg po qd Anette Guarneri- has been most effective med for him but current insurance offers limited coverage.  Trileptal Lamictal Buspar-Ineffective Naltrexone Trazodone  Review of Systems:  Review of Systems  Gastrointestinal:       Reports umbilical hernia  Musculoskeletal: Negative for gait problem.  Neurological: Negative for tremors.  Psychiatric/Behavioral:       Please refer to HPI    Medications: I have reviewed the patient's current medications.  Current Outpatient Medications  Medication Sig Dispense Refill  . ARIPiprazole (ABILIFY) 15 MG tablet Stop taking when Latuda becomes available 90 tablet 3  . cetirizine (ZYRTEC) 10 MG tablet Take 10 mg by mouth daily.    Marland Kitchen lamoTRIgine (LAMICTAL) 150 MG tablet Take 1 tablet (150 mg total) by  mouth daily. 90 tablet 1  . Multiple Vitamins-Minerals (ONE DAILY MULTIVITAMIN ADULT PO) Take by mouth.    . Oxcarbazepine (TRILEPTAL) 300 MG tablet Take 3 tablets (900 mg total) by mouth at bedtime. 270 tablet 0  . atenolol (TENORMIN) 50 MG tablet     . lurasidone (LATUDA) 40 MG TABS tablet Take 1/2 tablet daily with a meal for one week, then increase to 1 tablet daily  with a meal. 90 tablet 0   No current facility-administered medications for this visit.    Medication Side Effects: Other: increased wt, glucose, and cholesterol  Allergies: No Known Allergies  Past Medical History:  Diagnosis Date  . Allergy   . Anxiety   . Depression   . GERD (gastroesophageal reflux disease)   . Hypertension     Family History  Problem Relation Age of Onset  . Epilepsy Other   . Diabetes Other     Social History   Socioeconomic History  . Marital status: Single    Spouse name: Not on file  . Number of children: Not on file  . Years of education: Not on file  . Highest education level: Not on file  Occupational History  . Not on file  Tobacco Use  . Smoking status: Former Smoker    Packs/day: 1.50    Types: Cigarettes    Quit date: 06/22/2016    Years since quitting: 3.2  . Smokeless tobacco: Never Used  Substance and Sexual Activity  . Alcohol use: Yes    Alcohol/week: 60.0 standard drinks    Types: 60 Cans of beer per week    Comment: 8-12 beers a day  . Drug use: No  . Sexual activity: Not on file  Other Topics Concern  . Not on file  Social History Narrative  . Not on file   Social Determinants of Health   Financial Resource Strain:   . Difficulty of Paying Living Expenses: Not on file  Food Insecurity:   . Worried About Programme researcher, broadcasting/film/video in the Last Year: Not on file  . Ran Out of Food in the Last Year: Not on file  Transportation Needs:   . Lack of Transportation (Medical): Not on file  . Lack of Transportation (Non-Medical): Not on file  Physical Activity:   . Days of Exercise per Week: Not on file  . Minutes of Exercise per Session: Not on file  Stress:   . Feeling of Stress : Not on file  Social Connections:   . Frequency of Communication with Friends and Family: Not on file  . Frequency of Social Gatherings with Friends and Family: Not on file  . Attends Religious Services: Not on file  . Active Member of Clubs or  Organizations: Not on file  . Attends Banker Meetings: Not on file  . Marital Status: Not on file  Intimate Partner Violence:   . Fear of Current or Ex-Partner: Not on file  . Emotionally Abused: Not on file  . Physically Abused: Not on file  . Sexually Abused: Not on file    Past Medical History, Surgical history, Social history, and Family history were reviewed and updated as appropriate.   Please see review of systems for further details on the patient's review from today.   Objective:   Physical Exam:  Wt 300 lb (136.1 kg)   BMI 41.84 kg/m   Physical Exam Neurological:     Mental Status: He is alert and oriented to person, place,  and time.     Cranial Nerves: No dysarthria.  Psychiatric:        Attention and Perception: Attention and perception normal.        Mood and Affect: Mood is anxious.        Speech: Speech normal.        Behavior: Behavior is cooperative.        Thought Content: Thought content normal. Thought content is not paranoid or delusional. Thought content does not include homicidal or suicidal ideation. Thought content does not include homicidal or suicidal plan.        Cognition and Memory: Cognition and memory normal.        Judgment: Judgment normal.     Comments: Insight intact     Lab Review:     Component Value Date/Time   NA 139 12/03/2014 1829   K 4.2 12/03/2014 1829   CL 103 12/03/2014 1829   CO2 25 12/03/2014 1829   GLUCOSE 96 12/03/2014 1829   BUN 15 12/03/2014 1829   CREATININE 1.05 12/03/2014 1829   CALCIUM 10.0 12/03/2014 1829   PROT 7.9 12/03/2014 1829   ALBUMIN 4.7 12/03/2014 1829   AST 37 12/03/2014 1829   ALT 50 12/03/2014 1829   ALKPHOS 96 12/03/2014 1829   BILITOT 0.6 12/03/2014 1829   GFRNONAA >90 12/03/2014 1829   GFRAA >90 12/03/2014 1829       Component Value Date/Time   WBC 12.4 (H) 12/03/2014 1829   RBC 5.80 12/03/2014 1829   HGB 18.6 (H) 12/03/2014 1829   HCT 52.9 (H) 12/03/2014 1829   PLT  215 12/03/2014 1829   MCV 91.2 12/03/2014 1829   MCH 32.1 12/03/2014 1829   MCHC 35.2 12/03/2014 1829   RDW 13.2 12/03/2014 1829   LYMPHSABS 3.6 12/03/2014 1829   MONOABS 0.9 12/03/2014 1829   EOSABS 0.2 12/03/2014 1829   BASOSABS 0.1 12/03/2014 1829    No results found for: POCLITH, LITHIUM   No results found for: PHENYTOIN, PHENOBARB, VALPROATE, CBMZ   .res Assessment: Plan:   Patient seen for 30 minutes and greater than 50% of visit spent counseling patient and coordinating care.  Patient ask about switching back to Jordan since he now has a new job and insurance has changed and it appears that Jordan is covered and cost through mail order would be feasible.  Patient reports that his mood and anxiety were very well controlled in the past with Latuda, however Latuda became cost prohibitive with previous insurance and patient was then tried on other medications.  He has had weight gain and increase in glucose and cholesterol with Abilify.  Also, anxiety signs and symptoms have not been as well controlled with Abilify. Will therefore transition pt back to Jordan. Reviewed potential benefits, risks, and side effects of Latuda and reminded pt to take Latuda with a full meal to improve absorption and decrease risk of side effects.  Will start Latuda 40 mg 1/2 tab po qd with a meal for one week, then increase to 1 tab po qd with a meal for mood stabilization.  Advised pt to continue taking abilify until Jordan arrives through mail order. Advised pt to stop Abilify when he starts Jordan and that Abilify has a long half-life and blood levels will gradually decrease after stopping Abilify and starting Latuda.  Discussed follow-up in 4 to 6 weeks, however patient request to extend follow-up to 3 months and reports that he will call office if he experiences any side  effects or worsening signs and symptoms. Patient advised to contact office with any questions, adverse effects, or acute worsening in signs  and symptoms.  Jill AlexandersJustin was seen today for anxiety and follow-up.  Diagnoses and all orders for this visit:  Bipolar affective disorder, current episode mixed, current episode severity unspecified (HCC) -     lurasidone (LATUDA) 40 MG TABS tablet; Take 1/2 tablet daily with a meal for one week, then increase to 1 tablet daily with a meal. -     ARIPiprazole (ABILIFY) 15 MG tablet; Stop taking when Latuda becomes available -     Oxcarbazepine (TRILEPTAL) 300 MG tablet; Take 3 tablets (900 mg total) by mouth at bedtime. -     lamoTRIgine (LAMICTAL) 150 MG tablet; Take 1 tablet (150 mg total) by mouth daily.  Insomnia due to other mental disorder    Please see After Visit Summary for patient specific instructions.  Future Appointments  Date Time Provider Department Center  12/10/2019  9:00 AM Corie Chiquitoarter, Freddye Cardamone, PMHNP [facility] None    No orders of the defined types were placed in this encounter.     -------------------------------

## 2019-09-10 NOTE — Progress Notes (Signed)
   09/10/19 0952  Facial and Oral Movements  Muscles of Facial Expression 0  Lips and Perioral Area 0  Jaw 0  Tongue 0  Extremity Movements  Upper (arms, wrists, hands, fingers) 0  Lower (legs, knees, ankles, toes) 0  Trunk Movements  Neck, shoulders, hips 0  Overall Severity  Severity of abnormal movements (highest score from questions above) 0  Incapacitation due to abnormal movements 0  Patient's awareness of abnormal movements (rate only patient's report) 0  AIMS Total Score  AIMS Total Score 0

## 2019-09-15 ENCOUNTER — Other Ambulatory Visit: Payer: Self-pay | Admitting: Psychiatry

## 2019-09-15 DIAGNOSIS — F3160 Bipolar disorder, current episode mixed, unspecified: Secondary | ICD-10-CM

## 2019-09-16 NOTE — Telephone Encounter (Signed)
Requesting a year supply.

## 2019-09-17 ENCOUNTER — Telehealth: Payer: Self-pay | Admitting: Psychiatry

## 2019-09-17 ENCOUNTER — Other Ambulatory Visit: Payer: Self-pay

## 2019-09-17 DIAGNOSIS — F3160 Bipolar disorder, current episode mixed, unspecified: Secondary | ICD-10-CM

## 2019-09-17 MED ORDER — LURASIDONE HCL 40 MG PO TABS: ORAL_TABLET | ORAL | 0 refills | Status: DC

## 2019-09-17 NOTE — Telephone Encounter (Signed)
Pt called to advise he has not received the Latuda from [facility]. [facility] is too expensive. Also, they never got Rx you sent either. Please send Rx to [facility] for 90 day of Latuda.

## 2019-12-10 ENCOUNTER — Encounter: Payer: Self-pay | Admitting: Psychiatry

## 2019-12-10 ENCOUNTER — Ambulatory Visit: Payer: 59 | Admitting: Psychiatry

## 2019-12-10 ENCOUNTER — Ambulatory Visit (INDEPENDENT_AMBULATORY_CARE_PROVIDER_SITE_OTHER): Payer: 59 | Admitting: Psychiatry

## 2019-12-10 VITALS — Wt 300.0 lb

## 2019-12-10 DIAGNOSIS — F99 Mental disorder, not otherwise specified: Secondary | ICD-10-CM | POA: Diagnosis not present

## 2019-12-10 DIAGNOSIS — F5105 Insomnia due to other mental disorder: Secondary | ICD-10-CM | POA: Diagnosis not present

## 2019-12-10 DIAGNOSIS — F3160 Bipolar disorder, current episode mixed, unspecified: Secondary | ICD-10-CM

## 2019-12-10 MED ORDER — ARIPIPRAZOLE 10 MG PO TABS: ORAL_TABLET | ORAL | 0 refills | Status: DC

## 2019-12-10 NOTE — Progress Notes (Signed)
Hector Jackson [id number] [birth year] 39 y.o.  Virtual Visit via Telephone Note  I connected with pt on 12/10/19 at  2:00 PM EDT by telephone and verified that I am speaking with the correct person using two identifiers.   I discussed the limitations, risks, security and privacy concerns of performing an evaluation and management service by telephone and the availability of in person appointments. I also discussed with the patient that there may be a patient responsible charge related to this service. The patient expressed understanding and agreed to proceed.   I discussed the assessment and treatment plan with the patient. The patient was provided an opportunity to ask questions and all were answered. The patient agreed with the plan and demonstrated an understanding of the instructions.   The patient was advised to call back or seek an in-person evaluation if the symptoms worsen or if the condition fails to improve as anticipated.  I provided 20 minutes of non-face-to-face time during this encounter.  The patient was located at home.  The provider was located at [facility].   Corie Chiquito, PMHNP   Subjective:   Patient ID:  Hector Jackson is a 39 y.o. (DOB 08/22/1981) male.  Chief Complaint:  Chief Complaint  Patient presents with  . Depression  . Anxiety    HPI Hector Jackson presents for follow-up of mood and anxiety. He reports that he has had depression since change in medication. He reports that he has had severe depression and has not been able to get out of bed and leave the house. Has lost his job as a result. Very low energy and motivation. Anhedonia. He has had diminished interest. Staying in bed most of the time and will doze off and on during the day and the night. Appetite has been good. Denies eating more than usual and eats about once a day. Concentration has been impaired. Mood has been irritable. Has had increased anxiety and worry. Reports that he  has been scared to leave. Some physical s/s of anxiety. Denies any panic attacks. He denies impulsive or risky behavior. Denies excessive spending. He reports passive death wishes daily. Denies suicidal plan or intent.   He reports that he did not get Jordan for one month and started it in mid-January. He reports that he ran out of Abilify. He reports that he continued Latuda for 3-4 weeks and s/s worsened so he stopped taking Latuda. Reports running out of Abilify before starting Latuda.   Past Psychiatric Medication Trials: Abilify- Had manic s/s at 10 mg dose. Worsening depression at 15 mg po qd Kasandra Knudsen- has been most effective med for him but current insurance offers limited coverage.  Trileptal Lamictal Buspar-Ineffective Naltrexone Trazodone  Review of Systems:  Review of Systems  Musculoskeletal: Positive for arthralgias. Negative for gait problem.  Neurological: Negative for tremors.  Psychiatric/Behavioral:       Please refer to HPI    Medications: I have reviewed the patient's current medications.  Current Outpatient Medications  Medication Sig Dispense Refill  . atenolol (TENORMIN) 50 MG tablet     . cetirizine (ZYRTEC) 10 MG tablet Take 10 mg by mouth daily.    Marland Kitchen lamoTRIgine (LAMICTAL) 150 MG tablet Take 1 tablet (150 mg total) by mouth daily. 90 tablet 1  . Multiple Vitamins-Minerals (ONE DAILY MULTIVITAMIN ADULT PO) Take by mouth.    . Oxcarbazepine (TRILEPTAL) 300 MG tablet TAKE 3 TABLETS BY MOUTH AT  BEDTIME 270 tablet 3  . ARIPiprazole (ABILIFY)  10 MG tablet Take 1/2 tab po qd x 1 week, then increase to 1 tablet 90 tablet 0   No current facility-administered medications for this visit.    Medication Side Effects: Other: Has had wt gain with Abilify. Restlessness with Latuda  Allergies: No Known Allergies  Past Medical History:  Diagnosis Date  . Allergy   . Anxiety   . Depression   . GERD (gastroesophageal reflux disease)   . Hypertension     Family  History  Problem Relation Age of Onset  . Epilepsy Other   . Diabetes Other     Social History   Socioeconomic History  . Marital status: Single    Spouse name: Not on file  . Number of children: Not on file  . Years of education: Not on file  . Highest education level: Not on file  Occupational History  . Not on file  Tobacco Use  . Smoking status: Former Smoker    Packs/day: 1.50    Types: Cigarettes    Quit date: 06/22/2016    Years since quitting: 3.4  . Smokeless tobacco: Never Used  Substance and Sexual Activity  . Alcohol use: Yes    Alcohol/week: 60.0 standard drinks    Types: 60 Cans of beer per week    Comment: 8-12 beers a day  . Drug use: No  . Sexual activity: Not on file  Other Topics Concern  . Not on file  Social History Narrative  . Not on file   Social Determinants of Health   Financial Resource Strain:   . Difficulty of Paying Living Expenses:   Food Insecurity:   . Worried About Charity fundraiser in the Last Year:   . Arboriculturist in the Last Year:   Transportation Needs:   . Film/video editor (Medical):   Marland Kitchen Lack of Transportation (Non-Medical):   Physical Activity:   . Days of Exercise per Week:   . Minutes of Exercise per Session:   Stress:   . Feeling of Stress :   Social Connections:   . Frequency of Communication with Friends and Family:   . Frequency of Social Gatherings with Friends and Family:   . Attends Religious Services:   . Active Member of Clubs or Organizations:   . Attends Archivist Meetings:   Marland Kitchen Marital Status:   Intimate Partner Violence:   . Fear of Current or Ex-Partner:   . Emotionally Abused:   Marland Kitchen Physically Abused:   . Sexually Abused:     Past Medical History, Surgical history, Social history, and Family history were reviewed and updated as appropriate.   Please see review of systems for further details on the patient's review from today.   Objective:   Physical Exam:  Wt 300 lb  (136.1 kg)   BMI 41.84 kg/m   Physical Exam Neurological:     Mental Status: He is alert and oriented to person, place, and time.     Cranial Nerves: No dysarthria.  Psychiatric:        Attention and Perception: Attention and perception normal.        Mood and Affect: Mood is anxious and depressed.        Speech: Speech normal.        Behavior: Behavior is cooperative.        Thought Content: Thought content normal. Thought content is not paranoid or delusional. Thought content does not include homicidal or suicidal ideation. Thought content  does not include homicidal or suicidal plan.        Cognition and Memory: Cognition and memory normal.        Judgment: Judgment normal.     Comments: Insight intact     Lab Review:     Component Value Date/Time   NA 139 12/03/2014 1829   K 4.2 12/03/2014 1829   CL 103 12/03/2014 1829   CO2 25 12/03/2014 1829   GLUCOSE 96 12/03/2014 1829   BUN 15 12/03/2014 1829   CREATININE 1.05 12/03/2014 1829   CALCIUM 10.0 12/03/2014 1829   PROT 7.9 12/03/2014 1829   ALBUMIN 4.7 12/03/2014 1829   AST 37 12/03/2014 1829   ALT 50 12/03/2014 1829   ALKPHOS 96 12/03/2014 1829   BILITOT 0.6 12/03/2014 1829   GFRNONAA >90 12/03/2014 1829   GFRAA >90 12/03/2014 1829       Component Value Date/Time   WBC 12.4 (H) 12/03/2014 1829   RBC 5.80 12/03/2014 1829   HGB 18.6 (H) 12/03/2014 1829   HCT 52.9 (H) 12/03/2014 1829   PLT 215 12/03/2014 1829   MCV 91.2 12/03/2014 1829   MCH 32.1 12/03/2014 1829   MCHC 35.2 12/03/2014 1829   RDW 13.2 12/03/2014 1829   LYMPHSABS 3.6 12/03/2014 1829   MONOABS 0.9 12/03/2014 1829   EOSABS 0.2 12/03/2014 1829   BASOSABS 0.1 12/03/2014 1829    No results found for: POCLITH, LITHIUM   No results found for: PHENYTOIN, PHENOBARB, VALPROATE, CBMZ   .res Assessment: Plan:   Discussed several possible treatment options with patient.  He reports that his insurance has changed and he is on previous plan that has a  high deductible and does not cover brand-name medications.  Discussed that Abilify has been helpful overall for his mood signs and symptoms, particularly depression, and would therefore recommend resuming Abilify since he is currently having severe depression and Abilify has been helpful for similar symptoms in the past.  Discussed considering an alternative medication in the future with less risk of weight gain once acute depressive signs and symptoms improved and when patient has an insurance plan that would cover an alternative medication. Will continue Trileptal and lamotrigine for mood signs and symptoms. Will mail information release form to patient's home for him to complete in order to be able to speak with patient's wife since patient reports when he is more depressed it is difficult for him to initiate issue with office and discussed that with an information release his wife would be able to communicate important information if needed. Will follow-up in 3 months or sooner if clinically indicated since patient request less frequent follow-up due to insurance issues.  Requested that patient contact office in 1 month to discuss how he is doing with restart of Abilify. Patient advised to contact office with any questions, adverse effects, or acute worsening in signs and symptoms. Discussed safety plan and recommended that pt contact office if he is experiencing any worsening hopelessness and to go to the ER if he started to experience suicidal intent or plan.   Eithen was seen today for depression and anxiety.  Diagnoses and all orders for this visit:  Bipolar affective disorder, current episode mixed, current episode severity unspecified (HCC) -     ARIPiprazole (ABILIFY) 10 MG tablet; Take 1/2 tab po qd x 1 week, then increase to 1 tablet    Please see After Visit Summary for patient specific instructions.  Future Appointments  Date Time Provider Department Center  03/11/2020  9:30 AM  Corie Chiquito, PMHNP [facility] None    No orders of the defined types were placed in this encounter.     -------------------------------

## 2019-12-18 ENCOUNTER — Ambulatory Visit: Payer: 59 | Admitting: Psychiatry

## 2020-01-08 ENCOUNTER — Other Ambulatory Visit: Payer: Self-pay

## 2020-01-08 ENCOUNTER — Telehealth: Payer: Self-pay | Admitting: Psychiatry

## 2020-01-08 DIAGNOSIS — F3160 Bipolar disorder, current episode mixed, unspecified: Secondary | ICD-10-CM

## 2020-01-08 MED ORDER — OXCARBAZEPINE 300 MG PO TABS: 900.0000 mg | ORAL_TABLET | Freq: Every day | ORAL | 3 refills | Status: DC

## 2020-01-08 NOTE — Telephone Encounter (Signed)
Change in pharmacy from [facility] order, submitted to [facility] instead

## 2020-01-08 NOTE — Telephone Encounter (Signed)
Pt would like a new rx for his Trileptal 300mg  90day supply to be sent to [facility] in [place].

## 2020-03-11 ENCOUNTER — Telehealth (INDEPENDENT_AMBULATORY_CARE_PROVIDER_SITE_OTHER): Payer: 59 | Admitting: Psychiatry

## 2020-03-11 ENCOUNTER — Encounter: Payer: Self-pay | Admitting: Psychiatry

## 2020-03-11 VITALS — Wt 309.0 lb

## 2020-03-11 DIAGNOSIS — F99 Mental disorder, not otherwise specified: Secondary | ICD-10-CM

## 2020-03-11 DIAGNOSIS — F5105 Insomnia due to other mental disorder: Secondary | ICD-10-CM | POA: Diagnosis not present

## 2020-03-11 DIAGNOSIS — F3160 Bipolar disorder, current episode mixed, unspecified: Secondary | ICD-10-CM | POA: Diagnosis not present

## 2020-03-11 MED ORDER — OXCARBAZEPINE 600 MG PO TABS: 600.0000 mg | ORAL_TABLET | Freq: Every day | ORAL | 1 refills | Status: DC

## 2020-03-11 MED ORDER — LAMOTRIGINE 200 MG PO TABS: 200.0000 mg | ORAL_TABLET | Freq: Every day | ORAL | 1 refills | Status: DC

## 2020-03-11 MED ORDER — ARIPIPRAZOLE 10 MG PO TABS: 10.0000 mg | ORAL_TABLET | Freq: Every day | ORAL | 1 refills | Status: DC

## 2020-03-11 NOTE — Progress Notes (Signed)
Hector Jackson [id number] 1981/05/18 39 y.o.  Virtual Visit via Telephone Note  I connected with pt on 03/11/20 at  9:30 AM EDT by telephone and verified that I am speaking with the correct person using two identifiers.   I discussed the limitations, risks, security and privacy concerns of performing an evaluation and management service by telephone and the availability of in person appointments. I also discussed with the patient that there may be a patient responsible charge related to this service. The patient expressed understanding and agreed to proceed.   I discussed the assessment and treatment plan with the patient. The patient was provided an opportunity to ask questions and all were answered. The patient agreed with the plan and demonstrated an understanding of the instructions.   The patient was advised to call back or seek an in-person evaluation if the symptoms worsen or if the condition fails to improve as anticipated.  I provided 20 minutes of non-face-to-face time during this encounter.  The patient was located at home.  The provider was located at [facility].   Corie Chiquito, PMHNP   Subjective:   Patient ID:  Hector Jackson is a 39 y.o. (DOB [birth year]) male.  Chief Complaint:  Chief Complaint  Patient presents with  . Follow-up    h/o Bipolar D/O and anxiety    HPI Hector Jackson presents for follow-up of mood and anxiety. He reports that he is "a lot better." reports little to no depression. He reports that he has had some irritability r/t work and not having much down time. He reports that energy and motivation have been low. Has some difficulty getting out of bed at times. He reports that he has missed 2-3 days of work due to depression and difficulty getting out of bed. He reports that his sleep is interrupted due to frequent urination with diuretic and also due to indigestion. Eating about once a day. He reports poor concentration and focus. He  reports that it has been difficult to learn new material at work. He reports that he invested about $600 and this was somewhat impulsive. Denies any other impulsive or risky behavior. He reports that he occasionally notices his mood being somewhat elevated. He reports that he has been having some occ passive death wishes without suicidal intent or plan.   Working now Advertising account executive.  Past Psychiatric Medication Trials: Abilify- Had manic s/s at 10 mg dose. Worsening depression at 15 mg po qd Kasandra Knudsen- has been most effective med for him but current insurance offers limited coverage.  Trileptal Lamictal Buspar-Ineffective Naltrexone Trazodone  Review of Systems:  Review of Systems  Musculoskeletal: Negative for gait problem.  Neurological: Negative for tremors.       Denies any involuntary movements  Psychiatric/Behavioral:       Please refer to HPI    Medications: I have reviewed the patient's current medications.  Current Outpatient Medications  Medication Sig Dispense Refill  . ARIPiprazole (ABILIFY) 10 MG tablet Take 1 tablet (10 mg total) by mouth daily. 90 tablet 1  . cetirizine (ZYRTEC) 10 MG tablet Take 10 mg by mouth daily.    Marland Kitchen lamoTRIgine (LAMICTAL) 200 MG tablet Take 1 tablet (200 mg total) by mouth daily. 90 tablet 1  . losartan-hydrochlorothiazide (HYZAAR) 50-12.5 MG tablet Take 1 tablet by mouth daily.    . Multiple Vitamins-Minerals (ONE DAILY MULTIVITAMIN ADULT PO) Take by mouth.    . Oxcarbazepine (TRILEPTAL) 600 MG tablet Take 1 tablet (600 mg  total) by mouth at bedtime. 90 tablet 1   No current facility-administered medications for this visit.    Medication Side Effects: None  Allergies: No Known Allergies  Past Medical History:  Diagnosis Date  . Allergy   . Anxiety   . Depression   . GERD (gastroesophageal reflux disease)   . Hypertension     Family History  Problem Relation Age of Onset  . Epilepsy Other   . Diabetes Other     Social  History   Socioeconomic History  . Marital status: Single    Spouse name: Not on file  . Number of children: Not on file  . Years of education: Not on file  . Highest education level: Not on file  Occupational History  . Not on file  Tobacco Use  . Smoking status: Former Smoker    Packs/day: 1.50    Types: Cigarettes    Quit date: 06/22/2016    Years since quitting: 3.7  . Smokeless tobacco: Never Used  Vaping Use  . Vaping Use: Never used  Substance and Sexual Activity  . Alcohol use: Yes    Alcohol/week: 60.0 standard drinks    Types: 60 Cans of beer per week    Comment: 8-12 beers a day  . Drug use: No  . Sexual activity: Not on file  Other Topics Concern  . Not on file  Social History Narrative  . Not on file   Social Determinants of Health   Financial Resource Strain:   . Difficulty of Paying Living Expenses:   Food Insecurity:   . Worried About Charity fundraiser in the Last Year:   . Arboriculturist in the Last Year:   Transportation Needs:   . Film/video editor (Medical):   Marland Kitchen Lack of Transportation (Non-Medical):   Physical Activity:   . Days of Exercise per Week:   . Minutes of Exercise per Session:   Stress:   . Feeling of Stress :   Social Connections:   . Frequency of Communication with Friends and Family:   . Frequency of Social Gatherings with Friends and Family:   . Attends Religious Services:   . Active Member of Clubs or Organizations:   . Attends Archivist Meetings:   Marland Kitchen Marital Status:   Intimate Partner Violence:   . Fear of Current or Ex-Partner:   . Emotionally Abused:   Marland Kitchen Physically Abused:   . Sexually Abused:     Past Medical History, Surgical history, Social history, and Family history were reviewed and updated as appropriate.   Please see review of systems for further details on the patient's review from today.   Objective:   Physical Exam:  Wt (!) 309 lb (140.2 kg)   BMI 43.10 kg/m   Physical  Exam Neurological:     Mental Status: He is alert and oriented to person, place, and time.     Cranial Nerves: No dysarthria.  Psychiatric:        Attention and Perception: Attention and perception normal.        Mood and Affect: Mood is not anxious or elated.        Speech: Speech normal.        Behavior: Behavior is cooperative.        Thought Content: Thought content normal. Thought content is not paranoid or delusional. Thought content does not include homicidal or suicidal ideation. Thought content does not include homicidal or suicidal plan.  Cognition and Memory: Cognition and memory normal.        Judgment: Judgment normal.     Comments: Insight intact Mood presents as mildly depressed     Lab Review:     Component Value Date/Time   NA 139 12/03/2014 1829   K 4.2 12/03/2014 1829   CL 103 12/03/2014 1829   CO2 25 12/03/2014 1829   GLUCOSE 96 12/03/2014 1829   BUN 15 12/03/2014 1829   CREATININE 1.05 12/03/2014 1829   CALCIUM 10.0 12/03/2014 1829   PROT 7.9 12/03/2014 1829   ALBUMIN 4.7 12/03/2014 1829   AST 37 12/03/2014 1829   ALT 50 12/03/2014 1829   ALKPHOS 96 12/03/2014 1829   BILITOT 0.6 12/03/2014 1829   GFRNONAA >90 12/03/2014 1829   GFRAA >90 12/03/2014 1829       Component Value Date/Time   WBC 12.4 (H) 12/03/2014 1829   RBC 5.80 12/03/2014 1829   HGB 18.6 (H) 12/03/2014 1829   HCT 52.9 (H) 12/03/2014 1829   PLT 215 12/03/2014 1829   MCV 91.2 12/03/2014 1829   MCH 32.1 12/03/2014 1829   MCHC 35.2 12/03/2014 1829   RDW 13.2 12/03/2014 1829   LYMPHSABS 3.6 12/03/2014 1829   MONOABS 0.9 12/03/2014 1829   EOSABS 0.2 12/03/2014 1829   BASOSABS 0.1 12/03/2014 1829    No results found for: POCLITH, LITHIUM   No results found for: PHENYTOIN, PHENOBARB, VALPROATE, CBMZ   .res Assessment: Plan:   Discussed potential benefits, risks, and side effects of increasing Lamictal to 200 mg daily to improve depressive signs and symptoms.  Patient agrees  to increase in lamotrigine.  Discussed decreasing dose of Trileptal to possibly minimize excessive somnolence upon awakening.  Discussed decreasing Trileptal to 300 mg 2.5 tab po QHS initially and then to 600 mg po QHS. Continue Abilify 10 mg daily for mood stabilization. Discussed follow-up in 3 months, however patient requests follow-up in 6 months. Patient advised to contact office with any questions, adverse effects, or acute worsening in signs and symptoms.  Zyen was seen today for follow-up.  Diagnoses and all orders for this visit:  Bipolar affective disorder, current episode mixed, current episode severity unspecified (HCC) -     lamoTRIgine (LAMICTAL) 200 MG tablet; Take 1 tablet (200 mg total) by mouth daily. -     Oxcarbazepine (TRILEPTAL) 600 MG tablet; Take 1 tablet (600 mg total) by mouth at bedtime. -     ARIPiprazole (ABILIFY) 10 MG tablet; Take 1 tablet (10 mg total) by mouth daily.    Please see After Visit Summary for patient specific instructions.  No future appointments.  No orders of the defined types were placed in this encounter.     -------------------------------

## 2020-03-26 ENCOUNTER — Telehealth: Payer: Self-pay | Admitting: Psychiatry

## 2020-03-26 DIAGNOSIS — F3160 Bipolar disorder, current episode mixed, unspecified: Secondary | ICD-10-CM

## 2020-03-26 MED ORDER — OXCARBAZEPINE 600 MG PO TABS: 900.0000 mg | ORAL_TABLET | Freq: Every day | ORAL | 0 refills | Status: DC

## 2020-03-26 MED ORDER — LAMOTRIGINE 150 MG PO TABS: 150.0000 mg | ORAL_TABLET | Freq: Every day | ORAL | 1 refills | Status: DC

## 2020-03-26 NOTE — Telephone Encounter (Signed)
Hector Jackson called back from earlier message he left.  He is experiencing some severe depression since visit 03/11/20.  Needs advice on how to handle this.  Please call.

## 2020-03-27 ENCOUNTER — Other Ambulatory Visit: Payer: Self-pay

## 2020-03-27 DIAGNOSIS — F3160 Bipolar disorder, current episode mixed, unspecified: Secondary | ICD-10-CM

## 2020-03-27 MED ORDER — LAMOTRIGINE 150 MG PO TABS: 150.0000 mg | ORAL_TABLET | Freq: Every day | ORAL | 1 refills | Status: DC

## 2020-03-27 MED ORDER — OXCARBAZEPINE 600 MG PO TABS: 900.0000 mg | ORAL_TABLET | Freq: Every day | ORAL | 0 refills | Status: DC

## 2020-05-10 IMAGING — DX DG CHEST 1V
1 series · 1 of 1 positions shown · non-contrast
Comparison: April 23, 2011

CLINICAL DATA: Physical examination

EXAM:
CHEST  1 VIEW

[chest pa]
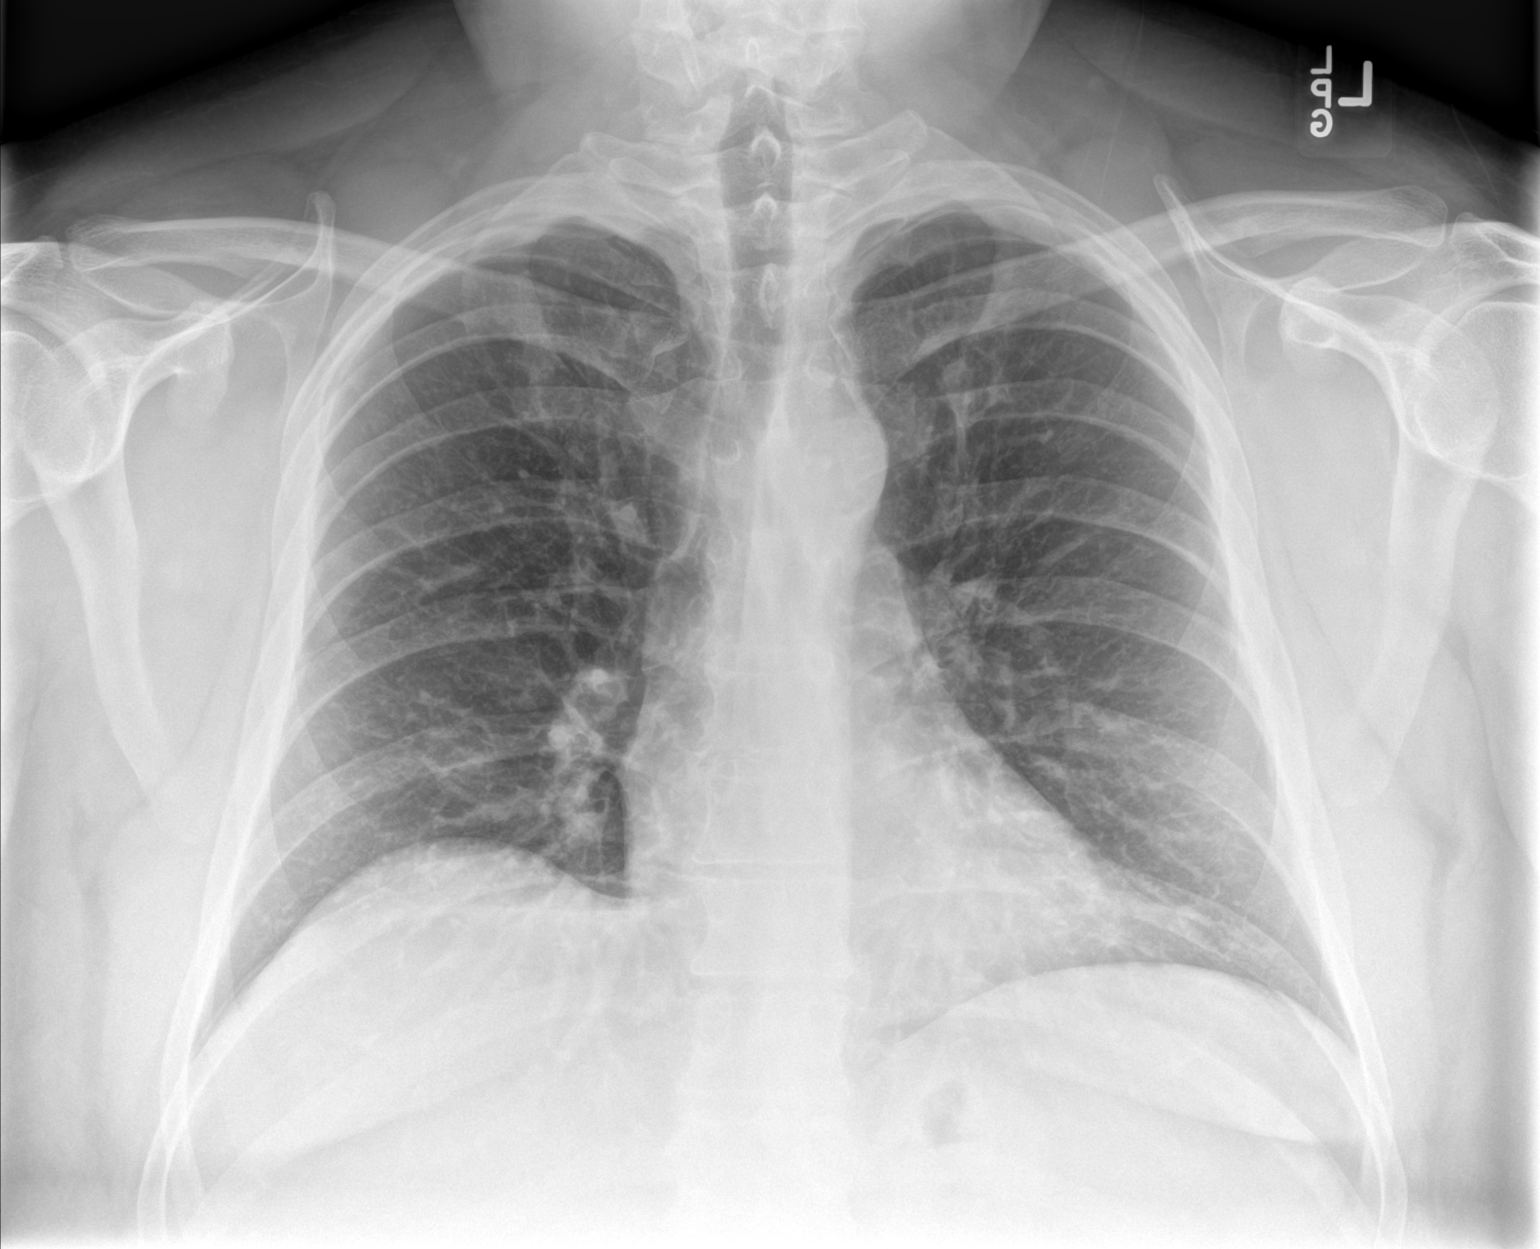

[1 of 1 positions shown; findings below may reference images not displayed]

FINDINGS: The lungs are clear. The heart size and pulmonary vascularity are
normal. No adenopathy. No bone lesions.
IMPRESSION: No abnormality noted.

## 2020-09-09 ENCOUNTER — Telehealth: Payer: Self-pay | Admitting: Psychiatry

## 2020-09-09 DIAGNOSIS — F3160 Bipolar disorder, current episode mixed, unspecified: Secondary | ICD-10-CM

## 2020-09-09 MED ORDER — ARIPIPRAZOLE 10 MG PO TABS: 10.0000 mg | ORAL_TABLET | Freq: Every day | ORAL | 1 refills | Status: DC

## 2020-09-09 NOTE — Addendum Note (Signed)
Addended by: Derenda Mis on: 09/09/2020 05:47 PM   Modules accepted: Orders

## 2020-09-09 NOTE — Telephone Encounter (Signed)
sent 

## 2020-09-09 NOTE — Telephone Encounter (Signed)
Pt requesting refill for Abilify 10 mg @ [facility] in [place] on file. Apt 1/10

## 2020-09-10 ENCOUNTER — Other Ambulatory Visit: Payer: Self-pay | Admitting: Psychiatry

## 2020-09-10 DIAGNOSIS — F3160 Bipolar disorder, current episode mixed, unspecified: Secondary | ICD-10-CM

## 2020-10-06 ENCOUNTER — Telehealth: Payer: Self-pay | Admitting: Psychiatry

## 2020-10-06 ENCOUNTER — Encounter: Payer: Self-pay | Admitting: Psychiatry

## 2020-10-06 ENCOUNTER — Telehealth (INDEPENDENT_AMBULATORY_CARE_PROVIDER_SITE_OTHER): Payer: 59 | Admitting: Psychiatry

## 2020-10-06 DIAGNOSIS — F3160 Bipolar disorder, current episode mixed, unspecified: Secondary | ICD-10-CM

## 2020-10-06 MED ORDER — OXCARBAZEPINE 300 MG PO TABS: 900.0000 mg | ORAL_TABLET | Freq: Every day | ORAL | 1 refills | Status: DC

## 2020-10-06 MED ORDER — ARIPIPRAZOLE 10 MG PO TABS: 10.0000 mg | ORAL_TABLET | Freq: Every day | ORAL | 1 refills | Status: DC

## 2020-10-06 MED ORDER — LAMOTRIGINE 150 MG PO TABS: 150.0000 mg | ORAL_TABLET | Freq: Every day | ORAL | 1 refills | Status: DC

## 2020-10-06 NOTE — Telephone Encounter (Signed)
[name], haughey are scheduled for a virtual visit with your provider today.    Just as we do with appointments in the office, we must obtain your consent to participate.  Your consent will be active for this visit and any virtual visit you may have with one of our providers in the next 365 days.    If you have a MyChart account, I can also send a copy of this consent to you electronically.  All virtual visits are billed to your insurance company just like a traditional visit in the office.  As this is a virtual visit, video technology does not allow for your provider to perform a traditional examination.  This may limit your provider's ability to fully assess your condition.  If your provider identifies any concerns that need to be evaluated in person or the need to arrange testing such as labs, EKG, etc, we will make arrangements to do so.    Although advances in technology are sophisticated, we cannot ensure that it will always work on either your end or our end.  If the connection with a video visit is poor, we may have to switch to a telephone visit.  With either a video or telephone visit, we are not always able to ensure that we have a secure connection.   I need to obtain your verbal consent now.   Are you willing to proceed with your visit today?   Hector Jackson has provided verbal consent on 10/06/2020 for a virtual visit (video or telephone).   Corie Chiquito, PMHNP 10/06/2020  9:15 AM

## 2020-10-06 NOTE — Progress Notes (Signed)
Hector Jackson [id number] 08-21-1981 40 y.o.  Virtual Visit via Telephone Note  I connected with pt on 10/06/20 at  9:00 AM EST by telephone and verified that I am speaking with the correct person using two identifiers.   I discussed the limitations, risks, security and privacy concerns of performing an evaluation and management service by telephone and the availability of in person appointments. I also discussed with the patient that there may be a patient responsible charge related to this service. The patient expressed understanding and agreed to proceed.   I discussed the assessment and treatment plan with the patient. The patient was provided an opportunity to ask questions and all were answered. The patient agreed with the plan and demonstrated an understanding of the instructions.   The patient was advised to call back or seek an in-person evaluation if the symptoms worsen or if the condition fails to improve as anticipated.  I provided 20 minutes of non-face-to-face time during this encounter.  The patient was located at home.  The provider was located at [facility].   Corie Chiquito, PMHNP   Subjective:   Patient ID:  Hector Jackson is a 40 y.o. (DOB 02/28/1981) male.  Chief Complaint:  Chief Complaint  Patient presents with  . Anxiety  . Follow-up    Mood disturbance, h/o Alcohol misuse    HPI [name] presents for follow-up of mood disturbance, anxiety, sleep disturbance, and ETOH misuse.  He reports that his mood has been "pretty good." Denies depressed mood. He has not had any recent periods where he has not been able to get out of bed and go to work. He reports that energy and motivation is ok for what he needs to do and then tends to sit around when he does not have anything to do. Denies any recent elevated moods, excessive spending, or impulsive behavior. 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   He reports that anxiety has been elevated, such as when  he goes to restaurants and grocery shopping. He reports that anxiety is triggered by large crowds. He denies any anxiety with meeting with people at work.  He reports that he notices some anxious thoughts when he is "just sitting around." Denies any panic attacks.   He reports that sleep has been "terrible." He reports frequent middle of the night awakenings (4-5 times a night) due to taking diuretic at bedtime. He reports that he feels as if he could sleep longer if he did not have to urinate. Goes to bed around 7-8 pm. Awakens at 5:30-6 am. He reports that his appetite has been good. He reports that his concentration and focus has been "terrible." He reports that he will frequently forgetting things and has to remind himself to do things. He frequently will lose his train of thought. He will forget things for work. Denies any SI.  He is working doing Ambulance person.  He reports that he started drinking daily. He reports that he is drinking about 6 beers a day. He reports that he does not feel that ETOH use is a problem. He reports that he is "managing and it is not getting out of time."   Past Psychiatric Medication Trials: Abilify- Had manic s/s at 10 mg dose. Worsening depression at 15 mg po qd Kasandra Knudsen- has been most effective med for him but current insurance offers limited coverage.  Trileptal Lamictal Buspar-Ineffective Naltrexone Trazodone   Review of Systems:  Review of Systems  Musculoskeletal: Negative for gait  problem.       Knee pain  Skin: Positive for rash.       He reports that he has a rash on his torso that itches. He reports that rash has been there about a month. Denies any spreading.   Neurological: Negative for tremors.  Psychiatric/Behavioral:       Please refer to HPI   He reports pain in his side.  Medications: I have reviewed the patient's current medications.  Current Outpatient Medications  Medication Sig Dispense Refill  . cetirizine (ZYRTEC) 10 MG  tablet Take 10 mg by mouth daily.    Marland Kitchen losartan-hydrochlorothiazide (HYZAAR) 50-12.5 MG tablet Take 1 tablet by mouth daily.    . Multiple Vitamins-Minerals (ONE DAILY MULTIVITAMIN ADULT PO) Take by mouth.    . ARIPiprazole (ABILIFY) 10 MG tablet Take 1 tablet (10 mg total) by mouth daily. 90 tablet 1  . lamoTRIgine (LAMICTAL) 150 MG tablet Take 1 tablet (150 mg total) by mouth daily. 90 tablet 1  . oxcarbazepine (TRILEPTAL) 300 MG tablet Take 3 tablets (900 mg total) by mouth at bedtime. 270 tablet 1   No current facility-administered medications for this visit.    Medication Side Effects: None  Denies any movement side effects.   Allergies: No Known Allergies  Past Medical History:  Diagnosis Date  . Allergy   . Anxiety   . Depression   . GERD (gastroesophageal reflux disease)   . Hypertension     Family History  Problem Relation Age of Onset  . Epilepsy Other   . Diabetes Other     Social History   Socioeconomic History  . Marital status: Single    Spouse name: Not on file  . Number of children: Not on file  . Years of education: Not on file  . Highest education level: Not on file  Occupational History  . Not on file  Tobacco Use  . Smoking status: Former Smoker    Packs/day: 1.50    Types: Cigarettes    Quit date: 06/22/2016    Years since quitting: 4.2  . Smokeless tobacco: Never Used  Vaping Use  . Vaping Use: Never used  Substance and Sexual Activity  . Alcohol use: Yes    Alcohol/week: 60.0 standard drinks    Types: 60 Cans of beer per week    Comment: 8-12 beers a day  . Drug use: No  . Sexual activity: Not on file  Other Topics Concern  . Not on file  Social History Narrative  . Not on file   Social Determinants of Health   Financial Resource Strain: Not on file  Food Insecurity: Not on file  Transportation Needs: Not on file  Physical Activity: Not on file  Stress: Not on file  Social Connections: Not on file  Intimate Partner Violence:  Not on file    Past Medical History, Surgical history, Social history, and Family history were reviewed and updated as appropriate.   Please see review of systems for further details on the patient's review from today.   Objective:   Physical Exam:  Wt (!) 310 lb (140.6 kg)   BMI 43.24 kg/m   Physical Exam Neurological:     Mental Status: He is alert and oriented to person, place, and time.     Cranial Nerves: No dysarthria.  Psychiatric:        Attention and Perception: Attention and perception normal.        Mood and Affect: Mood normal.  Speech: Speech normal.        Behavior: Behavior is cooperative.        Thought Content: Thought content normal. Thought content is not paranoid or delusional. Thought content does not include homicidal or suicidal ideation. Thought content does not include homicidal or suicidal plan.        Cognition and Memory: Cognition and memory normal.        Judgment: Judgment normal.     Comments: Insight intact     Lab Review:     Component Value Date/Time   NA 139 12/03/2014 1829   K 4.2 12/03/2014 1829   CL 103 12/03/2014 1829   CO2 25 12/03/2014 1829   GLUCOSE 96 12/03/2014 1829   BUN 15 12/03/2014 1829   CREATININE 1.05 12/03/2014 1829   CALCIUM 10.0 12/03/2014 1829   PROT 7.9 12/03/2014 1829   ALBUMIN 4.7 12/03/2014 1829   AST 37 12/03/2014 1829   ALT 50 12/03/2014 1829   ALKPHOS 96 12/03/2014 1829   BILITOT 0.6 12/03/2014 1829   GFRNONAA >90 12/03/2014 1829   GFRAA >90 12/03/2014 1829       Component Value Date/Time   WBC 12.4 (H) 12/03/2014 1829   RBC 5.80 12/03/2014 1829   HGB 18.6 (H) 12/03/2014 1829   HCT 52.9 (H) 12/03/2014 1829   PLT 215 12/03/2014 1829   MCV 91.2 12/03/2014 1829   MCH 32.1 12/03/2014 1829   MCHC 35.2 12/03/2014 1829   RDW 13.2 12/03/2014 1829   LYMPHSABS 3.6 12/03/2014 1829   MONOABS 0.9 12/03/2014 1829   EOSABS 0.2 12/03/2014 1829   BASOSABS 0.1 12/03/2014 1829    No results found  for: POCLITH, LITHIUM   No results found for: PHENYTOIN, PHENOBARB, VALPROATE, CBMZ   .res Assessment: Plan:   Sees PCP on 10/15/20. Recommended discussing with PCP about possible alternatives to diuretic since frequent urination throughout the night is disruptive to his sleep. He reports that he is having an annual physical exam and is scheduled for labs. He will request lab results be sent to this office.  He reports that ETOH use is currently "not a problem" and declines the need to re-start Naltrexone. Pt advised to contact office if he decides he does want to re-start Naltrexone.  Continue Abilify 10 mg po qd for mood s/s. Continue Lamictal 150 mg po qd for mood s/s. Continue Trileptal 900 mg po QHS for mood s/s.  Pt to follow-up in 6 months or sooner if clinically indicated.  Patient advised to contact office with any questions, adverse effects, or acute worsening in signs and symptoms.   Cordarius was seen today for anxiety and follow-up.  Diagnoses and all orders for this visit:  Bipolar affective disorder, current episode mixed, current episode severity unspecified (HCC) -     oxcarbazepine (TRILEPTAL) 300 MG tablet; Take 3 tablets (900 mg total) by mouth at bedtime. -     lamoTRIgine (LAMICTAL) 150 MG tablet; Take 1 tablet (150 mg total) by mouth daily. -     ARIPiprazole (ABILIFY) 10 MG tablet; Take 1 tablet (10 mg total) by mouth daily.    Please see After Visit Summary for patient specific instructions.  No future appointments.  No orders of the defined types were placed in this encounter.     -------------------------------

## 2021-04-09 ENCOUNTER — Other Ambulatory Visit: Payer: Self-pay | Admitting: Psychiatry

## 2021-04-09 DIAGNOSIS — F3160 Bipolar disorder, current episode mixed, unspecified: Secondary | ICD-10-CM

## 2021-07-09 ENCOUNTER — Telehealth: Payer: Self-pay | Admitting: Psychiatry

## 2021-07-09 NOTE — Telephone Encounter (Signed)
Okay to send? I didn't see where he was getting refills and taking on consistent basis? I may have overlooked

## 2021-07-09 NOTE — Telephone Encounter (Signed)
Pt requesting refill for Lamotrigine 150 mg 1/d @ [facility] [place]. Apt 10/17

## 2021-07-10 ENCOUNTER — Other Ambulatory Visit: Payer: Self-pay

## 2021-07-10 DIAGNOSIS — F3160 Bipolar disorder, current episode mixed, unspecified: Secondary | ICD-10-CM

## 2021-07-10 MED ORDER — LAMOTRIGINE 150 MG PO TABS: 150.0000 mg | ORAL_TABLET | Freq: Every day | ORAL | 0 refills | Status: DC

## 2021-07-10 NOTE — Telephone Encounter (Signed)
Pt reports he's been taking it consistently and he took his last dose last night. Informed him I would send his refill over now and we would see him Monday for his apt.

## 2021-07-10 NOTE — Telephone Encounter (Signed)
Refill sent.

## 2021-07-13 ENCOUNTER — Ambulatory Visit (INDEPENDENT_AMBULATORY_CARE_PROVIDER_SITE_OTHER): Payer: Self-pay | Admitting: Psychiatry

## 2021-07-13 ENCOUNTER — Encounter: Payer: Self-pay | Admitting: Psychiatry

## 2021-07-13 DIAGNOSIS — F3160 Bipolar disorder, current episode mixed, unspecified: Secondary | ICD-10-CM

## 2021-07-13 MED ORDER — ARIPIPRAZOLE 10 MG PO TABS: 10.0000 mg | ORAL_TABLET | Freq: Every day | ORAL | 1 refills | Status: DC

## 2021-07-13 MED ORDER — LAMOTRIGINE 150 MG PO TABS: 150.0000 mg | ORAL_TABLET | Freq: Every day | ORAL | 1 refills | Status: DC

## 2021-07-13 MED ORDER — BUPROPION HCL ER (XL) 150 MG PO TB24: 150.0000 mg | ORAL_TABLET | Freq: Every day | ORAL | 2 refills | Status: DC

## 2021-07-13 MED ORDER — OXCARBAZEPINE 300 MG PO TABS: 900.0000 mg | ORAL_TABLET | Freq: Every day | ORAL | 1 refills | Status: DC

## 2021-07-13 NOTE — Progress Notes (Signed)
Hector Jackson [id number] 02-07-1981 40 y.o.  Virtual Visit via Telephone Note  I connected with pt on 07/13/21 at  1:45 PM EDT by telephone and verified that I am speaking with the correct person using two identifiers.   I discussed the limitations, risks, security and privacy concerns of performing an evaluation and management service by telephone and the availability of in person appointments. I also discussed with the patient that there may be a patient responsible charge related to this service. The patient expressed understanding and agreed to proceed.   I discussed the assessment and treatment plan with the patient. The patient was provided an opportunity to ask questions and all were answered. The patient agreed with the plan and demonstrated an understanding of the instructions.   The patient was advised to call back or seek an in-person evaluation if the symptoms worsen or if the condition fails to improve as anticipated.  I provided 20 minutes of non-face-to-face time during this encounter.  The patient was located at home.  The provider was located at [facility].   Hector Jackson, PMHNP   Subjective:   Patient ID:  Hector Jackson is a 40 y.o. (DOB 21-Jun-1981) male.  Chief Complaint:  Chief Complaint  Patient presents with   Depression    HPI Hector Jackson presents for follow-up of anxiety, Bipolar D/O, and sleep disturbance. He reports low motivation and energy. He reports that he feels depressed "some days." He reports that he will occ miss work due to low energy and mood. He estimates missing about 20 days of work in the last year. He reports waking up 2-3 times a night. Estimates sleeping 5-6 hours a night. Eats lunch and dinner daily and reports appetite is ok. He reports difficulty with concentration and it occ interferes with work. Diminished interest and enjoyment in things other than family. Has stopped doing wood working or taking care of the yard.  Denies SI.   He reports anxiety has been "alright." Denies panic attacks. Denies severe anxiety in crowds.   Denies any manic s/s. He denies trading stocks or spending excessively. He reports occ mild irritability.  Daughter has seizure d/o. Son has an allergy issue. He reports some financial stressors.   Past Psychiatric Medication Trials: Abilify- Had manic s/s at 10 mg dose. Worsening depression at 15 mg po qd Kasandra Knudsen- has been most effective med for him but current insurance offers limited coverage.  Trileptal Lamictal Buspar-Ineffective Naltrexone Trazodone  Review of Systems:  Review of Systems  Constitutional:  Positive for fatigue.  Musculoskeletal:  Negative for gait problem.  Neurological:  Negative for tremors.  Psychiatric/Behavioral:         Please refer to HPI   Medications: I have reviewed the patient's current medications.  Current Outpatient Medications  Medication Sig Dispense Refill   buPROPion (WELLBUTRIN XL) 150 MG 24 hr tablet Take 1 tablet (150 mg total) by mouth daily. 30 tablet 2   cetirizine (ZYRTEC) 10 MG tablet Take 10 mg by mouth daily.     losartan-hydrochlorothiazide (HYZAAR) 50-12.5 MG tablet Take 1 tablet by mouth daily.     metFORMIN (GLUCOPHAGE-XR) 500 MG 24 hr tablet Take 500 mg by mouth daily with breakfast.     Multiple Vitamins-Minerals (ONE DAILY MULTIVITAMIN ADULT PO) Take by mouth.     ARIPiprazole (ABILIFY) 10 MG tablet Take 1 tablet (10 mg total) by mouth daily. 90 tablet 1   lamoTRIgine (LAMICTAL) 150 MG tablet Take 1 tablet (150  mg total) by mouth daily. 90 tablet 1   Oxcarbazepine (TRILEPTAL) 300 MG tablet Take 3 tablets (900 mg total) by mouth at bedtime. 270 tablet 1   No current facility-administered medications for this visit.    Medication Side Effects: None  Allergies: No Known Allergies  Past Medical History:  Diagnosis Date   Allergy    Anxiety    Depression    GERD (gastroesophageal reflux disease)     Hypertension    Prediabetes     Family History  Problem Relation Age of Onset   Epilepsy Other    Diabetes Other     Social History   Socioeconomic History   Marital status: Single    Spouse name: Not on file   Number of children: Not on file   Years of education: Not on file   Highest education level: Not on file  Occupational History   Not on file  Tobacco Use   Smoking status: Former    Packs/day: 1.50    Types: Cigarettes    Quit date: 06/22/2016    Years since quitting: 5.0   Smokeless tobacco: Never  Vaping Use   Vaping Use: Never used  Substance and Sexual Activity   Alcohol use: Yes    Alcohol/week: 60.0 standard drinks    Types: 60 Cans of beer per week    Comment: 8-12 beers a day   Drug use: No   Sexual activity: Not on file  Other Topics Concern   Not on file  Social History Narrative   Not on file   Social Determinants of Health   Financial Resource Strain: Not on file  Food Insecurity: Not on file  Transportation Needs: Not on file  Physical Activity: Not on file  Stress: Not on file  Social Connections: Not on file  Intimate Partner Violence: Not on file    Past Medical History, Surgical history, Social history, and Family history were reviewed and updated as appropriate.   Please see review of systems for further details on the patient's review from today.   Objective:   Physical Exam:  Wt 300 lb (136.1 kg)   BMI 41.84 kg/m   Physical Exam Neurological:     Mental Status: He is alert and oriented to person, place, and time.     Cranial Nerves: No dysarthria.  Psychiatric:        Attention and Perception: Attention and perception normal.        Mood and Affect: Mood is depressed.        Speech: Speech normal.        Behavior: Behavior is cooperative.        Thought Content: Thought content normal. Thought content is not paranoid or delusional. Thought content does not include homicidal or suicidal ideation. Thought content does not  include homicidal or suicidal plan.        Cognition and Memory: Cognition and memory normal.        Judgment: Judgment normal.     Comments: Insight intact    Lab Review:     Component Value Date/Time   NA 139 12/03/2014 1829   K 4.2 12/03/2014 1829   CL 103 12/03/2014 1829   CO2 25 12/03/2014 1829   GLUCOSE 96 12/03/2014 1829   BUN 15 12/03/2014 1829   CREATININE 1.05 12/03/2014 1829   CALCIUM 10.0 12/03/2014 1829   PROT 7.9 12/03/2014 1829   ALBUMIN 4.7 12/03/2014 1829   AST 37 12/03/2014 1829  ALT 50 12/03/2014 1829   ALKPHOS 96 12/03/2014 1829   BILITOT 0.6 12/03/2014 1829   GFRNONAA >90 12/03/2014 1829   GFRAA >90 12/03/2014 1829       Component Value Date/Time   WBC 12.4 (H) 12/03/2014 1829   RBC 5.80 12/03/2014 1829   HGB 18.6 (H) 12/03/2014 1829   HCT 52.9 (H) 12/03/2014 1829   PLT 215 12/03/2014 1829   MCV 91.2 12/03/2014 1829   MCH 32.1 12/03/2014 1829   MCHC 35.2 12/03/2014 1829   RDW 13.2 12/03/2014 1829   LYMPHSABS 3.6 12/03/2014 1829   MONOABS 0.9 12/03/2014 1829   EOSABS 0.2 12/03/2014 1829   BASOSABS 0.1 12/03/2014 1829    No results found for: POCLITH, LITHIUM   No results found for: PHENYTOIN, PHENOBARB, VALPROATE, CBMZ   .res Assessment: Plan:   Discussed potential benefits, risks, and side effects of Wellbutrin XL. Pt agrees to trial of Wellbutrin XL. Will start Wellbutrin XL 150 mg po qd to improve depressive s/s.  Continue Lamictal 140 mg po qd for mood s/s. Continue Trileptal 900 mg po QHS for mood and anxiety.  Continue Abilify 10 mg po qd for mood s/s.  Pt to follow-up in 6 months or sooner if clinically indicated.  Patient advised to contact office with any questions, adverse effects, or acute worsening in signs and symptoms.    Younes was seen today for depression.  Diagnoses and all orders for this visit:  Bipolar affective disorder, current episode mixed, current episode severity unspecified (HCC) -     buPROPion  (WELLBUTRIN XL) 150 MG 24 hr tablet; Take 1 tablet (150 mg total) by mouth daily. -     ARIPiprazole (ABILIFY) 10 MG tablet; Take 1 tablet (10 mg total) by mouth daily. -     lamoTRIgine (LAMICTAL) 150 MG tablet; Take 1 tablet (150 mg total) by mouth daily. -     Oxcarbazepine (TRILEPTAL) 300 MG tablet; Take 3 tablets (900 mg total) by mouth at bedtime.   Please see After Visit Summary for patient specific instructions.  No future appointments.  No orders of the defined types were placed in this encounter.     -------------------------------

## 2021-10-21 ENCOUNTER — Other Ambulatory Visit: Payer: Self-pay

## 2021-10-21 DIAGNOSIS — F3160 Bipolar disorder, current episode mixed, unspecified: Secondary | ICD-10-CM

## 2021-10-21 MED ORDER — LAMOTRIGINE 150 MG PO TABS: 150.0000 mg | ORAL_TABLET | Freq: Every day | ORAL | 1 refills | Status: DC

## 2021-10-21 MED ORDER — ARIPIPRAZOLE 10 MG PO TABS: 10.0000 mg | ORAL_TABLET | Freq: Every day | ORAL | 1 refills | Status: DC

## 2021-10-21 MED ORDER — OXCARBAZEPINE 300 MG PO TABS
900.0000 mg | ORAL_TABLET | Freq: Every day | ORAL | 1 refills | Status: DC
Start: 2021-10-21 — End: 2022-04-12

## 2021-10-21 MED ORDER — BUPROPION HCL ER (XL) 150 MG PO TB24: 150.0000 mg | ORAL_TABLET | Freq: Every day | ORAL | 1 refills | Status: DC

## 2021-12-07 ENCOUNTER — Encounter: Payer: Self-pay | Admitting: Internal Medicine

## 2021-12-28 ENCOUNTER — Other Ambulatory Visit: Payer: Self-pay

## 2021-12-28 ENCOUNTER — Telehealth: Payer: Self-pay | Admitting: Psychiatry

## 2021-12-28 DIAGNOSIS — F3160 Bipolar disorder, current episode mixed, unspecified: Secondary | ICD-10-CM

## 2021-12-28 MED ORDER — ARIPIPRAZOLE 10 MG PO TABS: 10.0000 mg | ORAL_TABLET | Freq: Every day | ORAL | 0 refills | Status: DC

## 2021-12-28 NOTE — Telephone Encounter (Signed)
Rx sent but needs to set up apt with Shanda Bumps  ?

## 2021-12-28 NOTE — Telephone Encounter (Signed)
Pt left a message at 1:24 pm asking for a refill on his abilify. He said that he never got it through [facility]. So he would like a script of abilfy sent to [facility] in [place] ?

## 2022-01-20 ENCOUNTER — Ambulatory Visit (INDEPENDENT_AMBULATORY_CARE_PROVIDER_SITE_OTHER): Payer: No Typology Code available for payment source | Admitting: Internal Medicine

## 2022-01-20 ENCOUNTER — Encounter: Payer: Self-pay | Admitting: Internal Medicine

## 2022-01-20 VITALS — BP 130/84 | HR 88 | Temp 97.2°F | Ht 71.0 in | Wt 287.4 lb

## 2022-01-20 DIAGNOSIS — R7989 Other specified abnormal findings of blood chemistry: Secondary | ICD-10-CM | POA: Diagnosis not present

## 2022-01-20 DIAGNOSIS — K219 Gastro-esophageal reflux disease without esophagitis: Secondary | ICD-10-CM | POA: Diagnosis not present

## 2022-01-20 DIAGNOSIS — K7581 Nonalcoholic steatohepatitis (NASH): Secondary | ICD-10-CM

## 2022-01-20 MED ORDER — PANTOPRAZOLE SODIUM 40 MG PO TBEC: 40.0000 mg | DELAYED_RELEASE_TABLET | Freq: Every day | ORAL | 3 refills | Status: DC

## 2022-01-20 NOTE — Patient Instructions (Signed)
Etiology of your abnormal liver function tests likely due to fatty liver disease.  Risk factors include diabetes, being overweight, and chronic alcohol use.   ? ?The best thing you can do is live a healthier lifestyle.  Recommend 5 days of moderate intensity exercise 30 minutes a day.  Continue to keep your diabetes under good control.  Limit alcohol use. ? ?For your chronic reflux, I am going to start you on a new medication called pantoprazole.  I want you to take this 30 minutes before breakfast each morning. ? ?We may consider upper endoscopy in the future to further evaluate. ? ?Otherwise follow-up in 6 months or sooner if needed. ? ?It was very nice meeting you today. ? ?Dr. Abbey Chatters ? ? ?Fatty Liver Disease Diet, Adult ?Nonalcoholic fatty liver disease is a condition that causes fat to build up in and around the liver. The disease makes it harder for the liver to work the way that it should. Following a healthy diet can help to keep nonalcoholic fatty liver disease under control. It can also help to prevent or improve conditions that are associated with the disease, such as heart disease, diabetes, high blood pressure, and abnormal cholesterol levels. ?Along with regular exercise, this diet: ?Promotes weight loss. ?Helps to control blood sugar levels. ?Helps to improve the way that the body uses insulin. ?What are tips for following this plan? ?Reading food labels ? ?Always check food labels for: ?The amount of saturated fat in a food. You should limit your intake of saturated fat. Saturated fat is found in foods that come from animals, including meat and dairy products such as butter, cheese, and whole milk. ?The amount of fiber in a food. You should choose high-fiber foods such as fruits, vegetables, and whole grains. Try to get 25-30 grams (g) of fiber a day. ?  ?Cooking ?When cooking, use heart-healthy oils that are high in monounsaturated fats. These include olive oil, canola oil, and avocado oil. ?Limit  frying or deep-frying foods. Cook foods using healthy methods such as baking, boiling, steaming, and grilling instead. ?Meal planning ?You may want to keep track of how many calories you take in. Eating the right amount of calories will help you achieve a healthy weight. Meeting with a registered dietitian can help you get started. ?Limit how often you eat takeout and fast food. These foods are usually very high in fat, salt, and sugar. ?Use the glycemic index (GI) to plan your meals. The index tells you how quickly a food will raise your blood sugar. Choose low-GI foods (GI less than 55). These foods take a longer time to raise blood sugar. A registered dietitian can help you identify foods lower on the GI scale. ?Lifestyle ?You may want to follow a Mediterranean diet. This diet includes a lot of vegetables, lean meats or fish, whole grains, fruits, and healthy oils and fats. ?What foods can I eat? ? ?  ?Fruits ?Bananas. Apples. Oranges. Grapes. Papaya. Mango. Pomegranate. Kiwi. Grapefruit. Cherries. ?Vegetables ?Lettuce. Spinach. Peas. Beets. Cauliflower. Cabbage. Broccoli. Carrots. Tomatoes. Squash. Eggplant. Herbs. Peppers. Onions. Cucumbers. Brussels sprouts. Yams and sweet potatoes. Beans. Lentils. ?Grains ?Whole wheat or whole-grain foods, including breads, crackers, cereals, and pasta. Stone-ground whole wheat. Unsweetened oatmeal. Bulgur. Barley. Quinoa. Brown or wild rice. Corn or whole wheat flour tortillas. ?Meats and other proteins ?Lean meats. Poultry. Tofu. Seafood and shellfish. ?Dairy ?Low-fat or fat-free dairy products, such as yogurt, cottage cheese, or cheese. ?Beverages ?Water. Sugar-free drinks. Tea. Coffee. Low-fat or  skim milk. Milk alternatives, such as soy or almond milk. Real fruit juice. ?Fats and oils ?Avocado. Canola or olive oil. Nuts and nut butters. Seeds. ?Seasonings and condiments ?Mustard. Relish. Low-fat, low-sugar ketchup and barbecue sauce. Low-fat or fat-free  mayonnaise. ?Sweets and desserts ?Sugar-free sweets. ?The items listed above may not be a complete list of foods and beverages you can eat. Contact a dietitian for more information. ?What foods should I limit or avoid? ?Meats and other proteins ?Limit red meat to 1-2 times a week. ?Dairy ?Full-fat dairy. ?Fats and oils ?Palm oil and coconut oil. Fried foods. ?Other foods ?Processed foods. Foods that contain a lot of salt or sodium. ?Sweets and desserts ?Sweets that contain sugar. ?Beverages ?Sweetened drinks, such as sweet tea, milkshakes, iced sweet drinks, and sodas. Alcohol. ?The items listed above may not be a complete list of foods and beverages you should avoid. Contact a dietitian for more information. ?Where to find more information ?The [employer] of Diabetes and Digestive and Kidney Diseases: [employer]is ?Summary ?Nonalcoholic fatty liver disease is a condition that causes fat to build up in and around the liver. ?Following a healthy diet can help to keep nonalcoholic fatty liver disease under control. Your diet should be rich in fruits, vegetables, whole grains, and lean proteins. ?Limit your intake of saturated fat. Saturated fat is found in foods that come from animals, including meat and dairy products such as butter, cheese, and whole milk. ?This diet promotes weight loss, helps to control blood sugar levels, and helps to improve the way that the body uses insulin. ?This information is not intended to replace advice given to you by your health care provider. Make sure you discuss any questions you have with your health care provider. ?Document Revised: 01/05/2019 Document Reviewed: 10/05/2018 ?Elsevier Patient Education ? [place]. ? ?Lifestyle and home remedies TO MANAGE REFLUX/HEARTBURN ? ?  ?You may eliminate or reduce the frequency of heartburn by making the following lifestyle changes: ?  ?Control your weight. Being overweight is a major risk factor for heartburn and GERD.  Excess pounds put pressure on your abdomen, pushing up your stomach and causing acid to back up into your esophagus.  ?  ?Eat smaller meals. 4 TO 6 MEALS A DAY. This reduces pressure on the lower esophageal sphincter, helping to prevent the valve from opening and acid from washing back into your esophagus. ?  ?  ?Loosen your belt. Clothes that fit tightly around your waist put pressure on your abdomen and the lower esophageal sphincter. ?  ?  ?Eliminate heartburn triggers. Everyone has specific triggers. Common triggers such as fatty or fried foods, spicy food, tomato sauce, carbonated beverages, alcohol, chocolate, mint, garlic, onion, caffeine and nicotine may make heartburn worse.  ?  ?Avoid stooping or bending. Tying your shoes is OK. Bending over for longer periods to weed your garden isn't, especially soon after eating.  ?  ?Don't lie down after a meal. Wait at least three to four hours after eating before going to bed, and don't lie down right after eating.  ? ? ?At [facility] we value your feedback. You may receive a survey about your visit today. Please share your experience as we strive to create trusting relationships with our patients to provide genuine, compassionate, quality care. ? ?We appreciate your understanding and patience as we review any laboratory studies, imaging, and other diagnostic tests that are ordered as we care for you. Our office policy is 5 business days for  review of these results, and any emergent or urgent results are addressed in a timely manner for your best interest. If you do not hear from our office in 1 week, please contact us.  ? ?We also encourage the use of MyChart, which contains your medical information for your review as well. If you are not enrolled in this feature, an access code is on this after visit summary for your convenience. Thank you for allowing [place] to be involved in your care. ? ?It was great to see you today!  I hope you have a great rest  of your Spring! ? ? ? ?Elon Alas. Abbey Chatters, D.O. ?Gastroenterology and Hepatology ?[facility] ? ?

## 2022-01-20 NOTE — Progress Notes (Signed)
? ? ?Primary Care Physician:  [place] Nation, MD ?Primary Gastroenterologist:  Dr. Abbey Chatters ? ?Chief Complaint  ?Patient presents with  ? New Patient (Initial Visit)  ?  Enlarged liver  ? ? ?HPI:   ?Hector Jackson is a 41 y.o. male who presents to clinic today by referral from his PCP Dr. Jimmye Norman for evaluation.  Recently underwent routine blood work which showed abnormal liver function test including AST 42, ALT 77, alk phos 135, T. bili 0.3.  Subsequent ultrasound showed hepatomegaly and fatty infiltration of the liver.  Otherwise unremarkable. ? ?Risk factors for fatty liver disease include obesity and diabetes.  Also notes alcohol use of 6-10 beers a day 2 to 3 days a week. ? ?No family history of liver disease.  States he was tested for hepatitis B and C and HIV which were negative. ? ?No illicit drug use. ? ?Also with chronic GERD.  States he has breakthrough symptoms almost on a daily basis.  Worse with foods such as spicy foods and pizza.  Notes having acid reflux up into his throat especially at night.  Takes Tums regularly which does not control his symptoms.  No dysphagia.  No epigastric or chest pain.  No melena hematochezia. ? ?No family history of colorectal malignancy. ? ?Past Medical History:  ?Diagnosis Date  ? Allergy   ? Anxiety   ? Depression   ? GERD (gastroesophageal reflux disease)   ? Hypertension   ? Prediabetes   ? ? ?No past surgical history on file. ? ?Current Outpatient Medications  ?Medication Sig Dispense Refill  ? ARIPiprazole (ABILIFY) 10 MG tablet Take 1 tablet (10 mg total) by mouth daily. 90 tablet 0  ? cetirizine (ZYRTEC) 10 MG tablet Take 10 mg by mouth daily.    ? lamoTRIgine (LAMICTAL) 150 MG tablet Take 1 tablet (150 mg total) by mouth daily. 90 tablet 1  ? losartan-hydrochlorothiazide (HYZAAR) 50-12.5 MG tablet Take 1 tablet by mouth daily.    ? metFORMIN (GLUCOPHAGE-XR) 500 MG 24 hr tablet Take 500 mg by mouth daily with breakfast.    ? buPROPion (WELLBUTRIN XL) 150  MG 24 hr tablet Take 1 tablet (150 mg total) by mouth daily. (Patient not taking: Reported on 01/20/2022) 90 tablet 1  ? Multiple Vitamins-Minerals (ONE DAILY MULTIVITAMIN ADULT PO) Take by mouth. (Patient not taking: Reported on 01/20/2022)    ? Oxcarbazepine (TRILEPTAL) 300 MG tablet Take 3 tablets (900 mg total) by mouth at bedtime. 270 tablet 1  ? ?No current facility-administered medications for this visit.  ? ? ?Allergies as of 01/20/2022  ? (No Known Allergies)  ? ? ?Family History  ?Problem Relation Age of Onset  ? Epilepsy Other   ? Diabetes Other   ? ? ?Social History  ? ?Socioeconomic History  ? Marital status: Single  ?  Spouse name: Not on file  ? Number of children: Not on file  ? Years of education: Not on file  ? Highest education level: Not on file  ?Occupational History  ? Not on file  ?Tobacco Use  ? Smoking status: Every Day  ?  Packs/day: 1.00  ?  Types: Cigarettes  ?  Last attempt to quit: 06/22/2016  ?  Years since quitting: 5.5  ? Smokeless tobacco: Never  ?Vaping Use  ? Vaping Use: Never used  ?Substance and Sexual Activity  ? Alcohol use: Yes  ?  Alcohol/week: 60.0 standard drinks  ?  Types: 60 Cans of beer per week  ?  Comment: 8-12 beers a day  ? Drug use: No  ? Sexual activity: Not on file  ?Other Topics Concern  ? Not on file  ?Social History Narrative  ? Not on file  ? ?Social Determinants of Health  ? ?Financial Resource Strain: Not on file  ?Food Insecurity: Not on file  ?Transportation Needs: Not on file  ?Physical Activity: Not on file  ?Stress: Not on file  ?Social Connections: Not on file  ?Intimate Partner Violence: Not on file  ? ? ?Subjective: ?Review of Systems  ?Constitutional:  Negative for chills and fever.  ?HENT:  Negative for congestion and hearing loss.   ?Eyes:  Negative for blurred vision and double vision.  ?Respiratory:  Negative for cough and shortness of breath.   ?Cardiovascular:  Negative for chest pain and palpitations.  ?Gastrointestinal:  Positive for  heartburn. Negative for abdominal pain, blood in stool, constipation, diarrhea, melena and vomiting.  ?Genitourinary:  Negative for dysuria and urgency.  ?Musculoskeletal:  Negative for joint pain and myalgias.  ?Skin:  Negative for itching and rash.  ?Neurological:  Negative for dizziness and headaches.  ?Psychiatric/Behavioral:  Negative for depression. The patient is not nervous/anxious.    ? ? ? ?Objective: ?BP 130/84   Pulse 88   Temp (!) 97.2 ?F (36.2 ?C)   Ht $R'5\' 11"'lj$  (1.803 m)   Wt 287 lb 6.4 oz (130.4 kg)   BMI 40.08 kg/m?  ?Physical Exam ?Constitutional:   ?   Appearance: Normal appearance. He is obese.  ?HENT:  ?   Head: Normocephalic and atraumatic.  ?Eyes:  ?   Extraocular Movements: Extraocular movements intact.  ?   Conjunctiva/sclera: Conjunctivae normal.  ?Cardiovascular:  ?   Rate and Rhythm: Normal rate and regular rhythm.  ?Pulmonary:  ?   Effort: Pulmonary effort is normal.  ?   Breath sounds: Normal breath sounds.  ?Abdominal:  ?   General: Bowel sounds are normal.  ?   Palpations: Abdomen is soft.  ?Musculoskeletal:     ?   General: Normal range of motion.  ?   Cervical back: Normal range of motion and neck supple.  ?Skin: ?   General: Skin is warm.  ?Neurological:  ?   General: No focal deficit present.  ?   Mental Status: He is alert and oriented to person, place, and time.  ?Psychiatric:     ?   Mood and Affect: Mood normal.     ?   Behavior: Behavior normal.  ? ? ? ?Assessment: ?*Steatohepatitis ?*Abnormal LFTs due to above ?*Chronic GERD-uncontrolled ? ?Plan: ?Patient's abnormal LFTs likely due to steatohepatitis, combination of NASH and ASH.  Fib 4 0.67, NAFLD fibrosis score of -1.6. ? ?Discussed in depth with him today.  Has already lost 15 pounds. Recommend 1-2# weight loss per week until ideal body weight through exercise & diet. ?Low fat/cholesterol diet.   ?Avoid sweets, sodas, fruit juices, sweetened beverages like tea, etc. ?Gradually increase exercise from 15 min daily up to  1/2 hr per day 5 days/week. ?Limit alcohol use. ? ?Ultrasound 11/30/2021 showed hepatomegaly with diffuse fatty change. ? ?GERD not controlled.  Takes Tums daily.  Will start on pantoprazole 40 mg daily and see how he does.  No alarm symptoms today to warrant EGD.  If still having breakthrough symptoms on PPI therapy, may need to consider upper endoscopy to further evaluate. ? ?Colonoscopy due at age 36. ? ?Follow-up in 6 months or sooner if needed. ? ?Thank you Dr.  Jimmye Norman for the kind referral ? ?01/20/2022 8:42 AM ? ? ?Disclaimer: This note was dictated with voice recognition software. Similar sounding words can inadvertently be transcribed and may not be corrected upon review. ? ?

## 2022-02-11 ENCOUNTER — Telehealth: Payer: Self-pay | Admitting: Psychiatry

## 2022-02-11 DIAGNOSIS — F3160 Bipolar disorder, current episode mixed, unspecified: Secondary | ICD-10-CM

## 2022-02-11 MED ORDER — LAMOTRIGINE 150 MG PO TABS: 150.0000 mg | ORAL_TABLET | Freq: Every day | ORAL | 0 refills | Status: DC

## 2022-02-11 NOTE — Telephone Encounter (Signed)
Rx sent 

## 2022-02-11 NOTE — Telephone Encounter (Signed)
Pt requests Lamotrigine sent to   [facility] [place], [place] - [address]  [address] [place], [place] [address]  Phone:  [phone]  Fax:  [phone]

## 2022-02-12 ENCOUNTER — Ambulatory Visit: Payer: Self-pay | Admitting: Psychiatry

## 2022-03-09 ENCOUNTER — Other Ambulatory Visit: Payer: Self-pay | Admitting: Psychiatry

## 2022-03-09 DIAGNOSIS — F3160 Bipolar disorder, current episode mixed, unspecified: Secondary | ICD-10-CM

## 2022-03-11 ENCOUNTER — Ambulatory Visit: Payer: Self-pay | Admitting: Psychiatry

## 2022-04-12 ENCOUNTER — Encounter: Payer: Self-pay | Admitting: Psychiatry

## 2022-04-12 ENCOUNTER — Ambulatory Visit (INDEPENDENT_AMBULATORY_CARE_PROVIDER_SITE_OTHER): Payer: No Typology Code available for payment source | Admitting: Psychiatry

## 2022-04-12 DIAGNOSIS — F3131 Bipolar disorder, current episode depressed, mild: Secondary | ICD-10-CM

## 2022-04-12 MED ORDER — ARIPIPRAZOLE 10 MG PO TABS: 10.0000 mg | ORAL_TABLET | Freq: Every day | ORAL | 2 refills | Status: DC

## 2022-04-12 MED ORDER — OXCARBAZEPINE 300 MG PO TABS: 900.0000 mg | ORAL_TABLET | Freq: Every day | ORAL | 2 refills | Status: DC

## 2022-04-12 MED ORDER — LAMOTRIGINE 150 MG PO TABS: 150.0000 mg | ORAL_TABLET | Freq: Every day | ORAL | 2 refills | Status: DC

## 2022-04-12 NOTE — Progress Notes (Signed)
Hector Jackson [id number] 04-23-1981 41 y.o.  Subjective:   Patient ID:  Hector Jackson is a 41 y.o. (DOB 07-18-1981) male.  Chief Complaint:  Chief Complaint  Patient presents with   Follow-up    Bipolar d/o, anxiety, and sleep disturbance    HPI CHRISOPHER Jackson presents to the office today for follow-up of anxiety, Bipolar D/O, and sleep disturbance. He reports that he stopped taking Wellbutrin XL due to increase in BP. He reports that his BP remained elevated for awhile and has not had BP re-checked lately. Denies any significant benefits with Wellbutrin XL. Denies depressed mood. 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 Denies impulsivity or risky behavior. He reports that his anxiety is "alright, some days are better than others." Denies panic. He reports that his energy and motivation are low. Has not been missing any work due to low energy and motivation. He reports that his concentration is fair and is meeting job expectations. Sleeping 5-6 hours. Appetite has been good. Denies SI.   He is working for [employer] for the [employer]. 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 He rotates shifts and this has not been bad. Rotates from days to nights. He reports more time off.   Family is health and doing well.   Denies any substance use other than ETOH and cigarettes.   Past Psychiatric Medication Trials: Abilify- Had manic s/s at 10 mg dose. Worsening depression at 15 mg po qd Kasandra Knudsen- has been most effective med for him but current insurance offers limited coverage.  Trileptal Lamictal Buspar-Ineffective Naltrexone Trazodone   AIMS    Flowsheet Row Office Visit from 04/12/2022 in [facility] Office Visit from 09/10/2019 in [facility] Office Visit from 05/01/2019 in [facility]  AIMS Total Score 0 0 0        Review of Systems:  Review of Systems  Musculoskeletal:  Negative for gait problem.        Umbilical hernia  Neurological:  Negative for tremors.  Psychiatric/Behavioral:         Please refer to HPI    Medications: I have reviewed the patient's current medications.  Current Outpatient Medications  Medication Sig Dispense Refill   cetirizine (ZYRTEC) 10 MG tablet Take 10 mg by mouth daily.     losartan-hydrochlorothiazide (HYZAAR) 100-25 MG tablet Take 1 tablet by mouth daily.     metFORMIN (GLUCOPHAGE-XR) 500 MG 24 hr tablet Take 500 mg by mouth daily with breakfast.     pantoprazole (PROTONIX) 40 MG tablet Take 1 tablet (40 mg total) by mouth daily. 90 tablet 3   ARIPiprazole (ABILIFY) 10 MG tablet Take 1 tablet (10 mg total) by mouth daily. 90 tablet 2   lamoTRIgine (LAMICTAL) 150 MG tablet Take 1 tablet (150 mg total) by mouth daily. 90 tablet 2   Oxcarbazepine (TRILEPTAL) 300 MG tablet Take 3 tablets (900 mg total) by mouth at bedtime. 270 tablet 2   No current facility-administered medications for this visit.    Medication Side Effects: None  Allergies: No Known Allergies  Past Medical History:  Diagnosis Date   Allergy    Anxiety    Depression    GERD (gastroesophageal reflux disease)    Hypertension    Prediabetes     Past Medical History, Surgical history, Social history, and Family history were reviewed and updated as appropriate.   Please see review of systems for further details on the patient's  review from today.   Objective:   Physical Exam:  BP (!) 162/91   Pulse (!) 111   Physical Exam Constitutional:      General: He is not in acute distress. Musculoskeletal:        General: No deformity.  Neurological:     Mental Status: He is alert and oriented to person, place, and time.     Coordination: Coordination normal.  Psychiatric:        Attention and Perception: Attention and perception normal. He does not perceive auditory or visual hallucinations.        Mood and Affect: Mood normal. Mood is not anxious or depressed. Affect is not  labile, blunt, angry or inappropriate.        Speech: Speech normal.        Behavior: Behavior normal.        Thought Content: Thought content normal. Thought content is not paranoid or delusional. Thought content does not include homicidal or suicidal ideation. Thought content does not include homicidal or suicidal plan.        Cognition and Memory: Cognition and memory normal.        Judgment: Judgment normal.     Comments: Insight intact     Lab Review:     Component Value Date/Time   NA 139 12/03/2014 1829   K 4.2 12/03/2014 1829   CL 103 12/03/2014 1829   CO2 25 12/03/2014 1829   GLUCOSE 96 12/03/2014 1829   BUN 15 12/03/2014 1829   CREATININE 1.05 12/03/2014 1829   CALCIUM 10.0 12/03/2014 1829   PROT 7.9 12/03/2014 1829   ALBUMIN 4.7 12/03/2014 1829   AST 37 12/03/2014 1829   ALT 50 12/03/2014 1829   ALKPHOS 96 12/03/2014 1829   BILITOT 0.6 12/03/2014 1829   GFRNONAA >90 12/03/2014 1829   GFRAA >90 12/03/2014 1829       Component Value Date/Time   WBC 12.4 (H) 12/03/2014 1829   RBC 5.80 12/03/2014 1829   HGB 18.6 (H) 12/03/2014 1829   HCT 52.9 (H) 12/03/2014 1829   PLT 215 12/03/2014 1829   MCV 91.2 12/03/2014 1829   MCH 32.1 12/03/2014 1829   MCHC 35.2 12/03/2014 1829   RDW 13.2 12/03/2014 1829   LYMPHSABS 3.6 12/03/2014 1829   MONOABS 0.9 12/03/2014 1829   EOSABS 0.2 12/03/2014 1829   BASOSABS 0.1 12/03/2014 1829    No results found for: "POCLITH", "LITHIUM"   No results found for: "PHENYTOIN", "PHENOBARB", "VALPROATE", "CBMZ"   .res Assessment: Plan:   Pt seen for 30 minutes and time spent discussing response to treatment and potential metabolic side effects with Abilify. Pt reports that PCP has been monitoring cholesterol and glucose levels. Pt agrees to signing information release so that office can request labs from PCP.  He reports that he would like to continue Abilify since it seems to be adequately controlling his mood at this time.  Will  continue current plan of care since target signs and symptoms are well controlled without any tolerability issues. Will continue Abilify 10 mg po qd for mood symptoms.  Continue Lamictal 150 mg po qd for mood symptoms.  Continue Trileptal 900 mg po QHS for mood symptoms.  Recommended pt follow-up with PCP regarding elevated BP.  Pt to follow-up in 9 months or sooner if clinically indicated.  Patient advised to contact office with any questions, adverse effects, or acute worsening in signs and symptoms.   Thayne was seen today for follow-up.  Diagnoses and all orders for this visit:  Bipolar affective disorder, currently depressed, mild (HCC) -     ARIPiprazole (ABILIFY) 10 MG tablet; Take 1 tablet (10 mg total) by mouth daily. -     lamoTRIgine (LAMICTAL) 150 MG tablet; Take 1 tablet (150 mg total) by mouth daily. -     Oxcarbazepine (TRILEPTAL) 300 MG tablet; Take 3 tablets (900 mg total) by mouth at bedtime.     Please see After Visit Summary for patient specific instructions.  Future Appointments  Date Time Provider [place]  01/17/2023 10:00 AM Thayer Headings, PMHNP CP-CP None    No orders of the defined types were placed in this encounter.   -------------------------------

## 2022-06-16 ENCOUNTER — Encounter: Payer: Self-pay | Admitting: *Deleted

## 2022-10-28 ENCOUNTER — Telehealth: Payer: Self-pay | Admitting: Psychiatry

## 2022-10-28 NOTE — Telephone Encounter (Signed)
Patient notified. He also said his medications would run out before his appt. Told him to just call the pharmacy to let them know he needed refills.

## 2022-10-28 NOTE — Telephone Encounter (Signed)
LVM to RC 

## 2022-10-28 NOTE — Telephone Encounter (Signed)
Patient lvm at 11:41 stating that he went to see his family doctor and was prescribed methizine I think he said couldn't make out the name. But he would like to know if this medication will interact with current medications. Ph: [phone]

## 2022-10-28 NOTE — Telephone Encounter (Signed)
Please let him know that his medications should not interact with Naltrexone and that the main concern for interaction with Naltrexone is that it blocks opiate receptors and interferes with opiate pain medication.

## 2022-12-02 ENCOUNTER — Ambulatory Visit: Payer: No Typology Code available for payment source | Admitting: Internal Medicine

## 2022-12-02 ENCOUNTER — Encounter: Payer: Self-pay | Admitting: Internal Medicine

## 2022-12-02 VITALS — BP 143/97 | HR 118 | Temp 97.5°F | Ht 71.0 in | Wt 325.4 lb

## 2022-12-02 DIAGNOSIS — R7989 Other specified abnormal findings of blood chemistry: Secondary | ICD-10-CM

## 2022-12-02 DIAGNOSIS — K219 Gastro-esophageal reflux disease without esophagitis: Secondary | ICD-10-CM

## 2022-12-02 DIAGNOSIS — K7581 Nonalcoholic steatohepatitis (NASH): Secondary | ICD-10-CM

## 2022-12-02 MED ORDER — PANTOPRAZOLE SODIUM 40 MG PO TBEC: 40.0000 mg | DELAYED_RELEASE_TABLET | Freq: Every day | ORAL | 3 refills | Status: DC

## 2022-12-02 NOTE — Progress Notes (Signed)
Primary Care Physician:  [address] Nation, MD Primary Gastroenterologist:  Dr. Abbey Chatters  Chief Complaint  Patient presents with   Follow-up    Follow up on GERD and Steatohepatitis    HPI:   Hector Jackson is a 42 y.o. male who presents to clinic today for follow up visit.  Routine blood work in 2023 which showed abnormal liver function test including AST 42, ALT 77, alk phos 135, T. bili 0.3.  Subsequent ultrasound showed hepatomegaly and fatty infiltration of the liver.  Otherwise unremarkable.  Risk factors for fatty liver disease include obesity and diabetes.  Also notes alcohol use though has cut back a lot. Only drinking beer 1-2 times a month.   Has gained 50 lbs since last visit.   No family history of liver disease.  States he was tested for hepatitis B and C and HIV which were negative.  No illicit drug use.  Also with chronic GERD.  Previously started on pantoprazole 40 mg daily last visit and states this is vastly improved.   No family history of colorectal malignancy.  Past Medical History:  Diagnosis Date   Allergy    Anxiety    Depression    GERD (gastroesophageal reflux disease)    Hypertension    Prediabetes     No past surgical history on file.  Current Outpatient Medications  Medication Sig Dispense Refill   ARIPiprazole (ABILIFY) 10 MG tablet Take 1 tablet (10 mg total) by mouth daily. 90 tablet 2   cetirizine (ZYRTEC) 10 MG tablet Take 10 mg by mouth daily.     lamoTRIgine (LAMICTAL) 150 MG tablet Take 1 tablet (150 mg total) by mouth daily. 90 tablet 2   losartan-hydrochlorothiazide (HYZAAR) 100-25 MG tablet Take 1 tablet by mouth daily.     metFORMIN (GLUCOPHAGE-XR) 500 MG 24 hr tablet Take 500 mg by mouth daily with breakfast.     naltrexone (DEPADE) 50 MG tablet Take 50 mg by mouth daily.     pantoprazole (PROTONIX) 40 MG tablet Take 1 tablet (40 mg total) by mouth daily. 90 tablet 3   Oxcarbazepine (TRILEPTAL) 300 MG tablet Take 3 tablets  (900 mg total) by mouth at bedtime. 270 tablet 2   No current facility-administered medications for this visit.    Allergies as of 12/02/2022   (No Known Allergies)    Family History  Problem Relation Age of Onset   Epilepsy Other    Diabetes Other     Social History   Socioeconomic History   Marital status: Married    Spouse name: Not on file   Number of children: Not on file   Years of education: Not on file   Highest education level: Not on file  Occupational History   Not on file  Tobacco Use   Smoking status: Former    Packs/day: 1.00    Types: Cigarettes    Quit date: 06/22/2016    Years since quitting: 6.4   Smokeless tobacco: Current  Vaping Use   Vaping Use: Every day  Substance and Sexual Activity   Alcohol use: Not Currently    Comment: 10-15 beers about 3-4 times a week   Drug use: No   Sexual activity: Not on file  Other Topics Concern   Not on file  Social History Narrative   Not on file   Social Determinants of Health   Financial Resource Strain: Not on file  Food Insecurity: Not on file  Transportation Needs: Not  on file  Physical Activity: Not on file  Stress: Not on file  Social Connections: Not on file  Intimate Partner Violence: Not on file    Subjective: Review of Systems  Constitutional:  Negative for chills and fever.  HENT:  Negative for congestion and hearing loss.   Eyes:  Negative for blurred vision and double vision.  Respiratory:  Negative for cough and shortness of breath.   Cardiovascular:  Negative for chest pain and palpitations.  Gastrointestinal:  Positive for heartburn. Negative for abdominal pain, blood in stool, constipation, diarrhea, melena and vomiting.  Genitourinary:  Negative for dysuria and urgency.  Musculoskeletal:  Negative for joint pain and myalgias.  Skin:  Negative for itching and rash.  Neurological:  Negative for dizziness and headaches.  Psychiatric/Behavioral:  Negative for depression. The  patient is not nervous/anxious.        Objective: BP (!) 143/97   Pulse (!) 118   Temp (!) 97.5 F (36.4 C)   Ht '5\' 11"'$  (1.803 m)   Wt (!) 325 lb 6.4 oz (147.6 kg)   BMI 45.38 kg/m  Physical Exam Constitutional:      Appearance: Normal appearance. He is obese.  HENT:     Head: Normocephalic and atraumatic.  Eyes:     Extraocular Movements: Extraocular movements intact.     Conjunctiva/sclera: Conjunctivae normal.  Cardiovascular:     Rate and Rhythm: Normal rate and regular rhythm.  Pulmonary:     Effort: Pulmonary effort is normal.     Breath sounds: Normal breath sounds.  Abdominal:     General: Bowel sounds are normal.     Palpations: Abdomen is soft.  Musculoskeletal:        General: Normal range of motion.     Cervical back: Normal range of motion and neck supple.  Skin:    General: Skin is warm.  Neurological:     General: No focal deficit present.     Mental Status: He is alert and oriented to person, place, and time.  Psychiatric:        Mood and Affect: Mood normal.        Behavior: Behavior normal.      Assessment: *Steatohepatitis *Abnormal LFTs due to above *Chronic GERD-uncontrolled  Plan: Patient's abnormal LFTs likely due to steatohepatitis, combination of NASH and ASH.  Fib 4 0.67, NAFLD fibrosis score of -1.6.  Discussed in depth with him today.  Recommend 1-2# weight loss per week until ideal body weight through exercise & diet. Low fat/cholesterol diet.   Avoid sweets, sodas, fruit juices, sweetened beverages like tea, etc. Gradually increase exercise from 15 min daily up to 1/2 hr per day 5 days/week. Limit alcohol use.  Ultrasound 11/30/2021 showed hepatomegaly with diffuse fatty change.  States he recently had blood work done at [facility]. Will request record today.   GERD much better controlled on pantoprazole daily. Will continue. Refilled today.   Colonoscopy due at age 24.  Follow-up in 6 months or sooner if  needed.   12/02/2022 3:39 PM   Disclaimer: This note was dictated with voice recognition software. Similar sounding words can inadvertently be transcribed and may not be corrected upon review.

## 2022-12-02 NOTE — Patient Instructions (Addendum)
Hector Jackson.  I will request your most recent blood work from [facility].  We will let you know if we need to perform any further testing.  We will plan on repeat ultrasound of your liver next year.  Recommend 1-2# weight loss per week until ideal body weight through exercise & diet. Low fat/cholesterol diet.   Avoid sweets, sodas, fruit juices, sweetened beverages like tea, etc. Gradually increase exercise from 15 min daily up to 1 hr per day 5 days/week. Limit alcohol use.  Continue on pantoprazole for your chronic reflux.  I sent in a year supply refills today.  Follow-up in 6 months.  It was good seeing you again today.  Dr. Abbey Chatters  Nonalcoholic Fatty Liver Disease Diet, Adult Nonalcoholic fatty liver disease is a condition that causes fat to build up in and around the liver. The disease makes it harder for the liver to work the way that it should. Following a healthy diet can help to keep nonalcoholic fatty liver disease under control. It can also help to prevent or improve conditions that are associated with the disease, such as heart disease, diabetes, high blood pressure, and abnormal cholesterol levels. Along with regular exercise, this diet: Promotes weight loss. Helps to control blood sugar levels. Helps to improve the way that the body uses insulin. What are tips for following this plan? Reading food labels  Always check food labels for: The amount of saturated fat in a food. You should limit your intake of saturated fat. Saturated fat is found in foods that come from animals, including meat and dairy products such as butter, cheese, and whole milk. The amount of fiber in a food. You should choose high-fiber foods such as fruits, vegetables, and whole grains. Try to get 25-30 grams (g) of fiber a day.   Cooking When cooking, use heart-healthy oils that are high in monounsaturated fats. These include olive oil, canola oil, and avocado oil. Limit  frying or deep-frying foods. Cook foods using healthy methods such as baking, boiling, steaming, and grilling instead. Meal planning You may want to keep track of how many calories you take in. Eating the right amount of calories will help you achieve a healthy weight. Meeting with a registered dietitian can help you get started. Limit how often you eat takeout and fast food. These foods are usually very high in fat, salt, and sugar. Use the glycemic index (GI) to plan your meals. The index tells you how quickly a food will raise your blood sugar. Choose low-GI foods (GI less than 55). These foods take a longer time to raise blood sugar. A registered dietitian can help you identify foods lower on the GI scale. Lifestyle You may want to follow a Mediterranean diet. This diet includes a lot of vegetables, lean meats or fish, whole grains, fruits, and healthy oils and fats. What foods can I eat?    Fruits Bananas. Apples. Oranges. Grapes. Papaya. Mango. Pomegranate. Kiwi. Grapefruit. Cherries. Vegetables Lettuce. Spinach. Peas. Beets. Cauliflower. Cabbage. Broccoli. Carrots. Tomatoes. Squash. Eggplant. Herbs. Peppers. Onions. Cucumbers. Brussels sprouts. Yams and sweet potatoes. Beans. Lentils. Grains Whole wheat or whole-grain foods, including breads, crackers, cereals, and pasta. Stone-ground whole wheat. Unsweetened oatmeal. Bulgur. Barley. Quinoa. Brown or wild rice. Corn or whole wheat flour tortillas. Meats and other proteins Lean meats. Poultry. Tofu. Seafood and shellfish. Dairy Low-fat or fat-free dairy products, such as yogurt, cottage cheese, or cheese. Beverages Water. Sugar-free drinks. Tea. Coffee. Low-fat or  skim milk. Milk alternatives, such as soy or almond milk. Real fruit juice. Fats and oils Avocado. Canola or olive oil. Nuts and nut butters. Seeds. Seasonings and condiments Mustard. Relish. Low-fat, low-sugar ketchup and barbecue sauce. Low-fat or fat-free  mayonnaise. Sweets and desserts Sugar-free sweets. The items listed above may not be a complete list of foods and beverages you can eat. Contact a dietitian for more information. What foods should I limit or avoid? Meats and other proteins Limit red meat to 1-2 times a week. Dairy [employer]. Fats and oils Palm oil and coconut oil. Fried foods. Other foods Processed foods. Foods that contain a lot of salt or sodium. Sweets and desserts Sweets that contain sugar. Beverages Sweetened drinks, such as sweet tea, milkshakes, iced sweet drinks, and sodas. Alcohol. The items listed above may not be a complete list of foods and beverages you should avoid. Contact a dietitian for more information. Where to find more information The [employer] of Diabetes and Digestive and Kidney Diseases: [employer] Summary Nonalcoholic fatty liver disease is a condition that causes fat to build up in and around the liver. Following a healthy diet can help to keep nonalcoholic fatty liver disease under control. Your diet should be rich in fruits, vegetables, whole grains, and lean proteins. Limit your intake of saturated fat. Saturated fat is found in foods that come from animals, including meat and dairy products such as butter, cheese, and whole milk. This diet promotes weight loss, helps to control blood sugar levels, and helps to improve the way that the body uses insulin. This information is not intended to replace advice given to you by your health care provider. Make sure you discuss any questions you have with your health care provider. Document Revised: 01/05/2019 Document Reviewed: 10/05/2018 Elsevier Patient Education  [place].

## 2023-01-13 ENCOUNTER — Telehealth: Payer: Self-pay | Admitting: Psychiatry

## 2023-01-13 DIAGNOSIS — F3131 Bipolar disorder, current episode depressed, mild: Secondary | ICD-10-CM

## 2023-01-13 MED ORDER — LAMOTRIGINE 150 MG PO TABS: 150.0000 mg | ORAL_TABLET | Freq: Every day | ORAL | 0 refills | Status: DC

## 2023-01-13 NOTE — Telephone Encounter (Signed)
Pt requesting Rx for Lamotrigine 150 mg out tomorrow.  [facility]/22

## 2023-01-13 NOTE — Telephone Encounter (Signed)
Sent!

## 2023-01-14 ENCOUNTER — Institutional Professional Consult (permissible substitution): Payer: No Typology Code available for payment source | Admitting: Pulmonary Disease

## 2023-01-17 ENCOUNTER — Encounter: Payer: Self-pay | Admitting: Psychiatry

## 2023-01-17 ENCOUNTER — Ambulatory Visit (INDEPENDENT_AMBULATORY_CARE_PROVIDER_SITE_OTHER): Payer: No Typology Code available for payment source | Admitting: Psychiatry

## 2023-01-17 DIAGNOSIS — F3131 Bipolar disorder, current episode depressed, mild: Secondary | ICD-10-CM

## 2023-01-17 MED ORDER — CARIPRAZINE HCL 1.5 MG PO CAPS: 1.5000 mg | ORAL_CAPSULE | Freq: Every day | ORAL | 1 refills | Status: DC

## 2023-01-17 MED ORDER — OXCARBAZEPINE 300 MG PO TABS: 900.0000 mg | ORAL_TABLET | Freq: Every day | ORAL | 2 refills | Status: DC

## 2023-01-17 MED ORDER — LAMOTRIGINE 150 MG PO TABS: 150.0000 mg | ORAL_TABLET | Freq: Every day | ORAL | 2 refills | Status: DC

## 2023-01-17 NOTE — Progress Notes (Unsigned)
Hector Jackson [id number] 1981/03/26 42 y.o.  Subjective:   Patient ID:  Hector Jackson is a 42 y.o. (DOB 08-May-1981) male.  Chief Complaint: No chief complaint on file.   HPI Hector Jackson presents to the office today for follow-up of bipolar disorder, anxiety, and sleep disturbance.  He reports, "no energy." Motivation is also low. Denies sad mood. "Most of the time, I'm not doing anything."  Denies irritability. He reports some impulsivity to include sports betting. Reports that betting has been on occasion. Denies elevated moods. Sleep has been poor with middle of the night awakenings every couple of hours. He reports poor concentration and focus. He reports that he has had difficulty studying for an exam at work. He reports that his appetite has been good. He reports, "I try not to overeat." He reports that he is not physically active. Denies SI.   He reports that he has been drinking alcohol regularly. He reports that he drinks more when off work. Has a week off each month. He reports that he is drinking about 15 beers a day/night when he does drink. He reports that Naltrexone was not helpful for cravings, so he stopped taking it. He reports that he has been drinking "because I am bored."   He reports that PCP "wants me to get a cPap." He reports that PCP referred him to Pulmonologist and he cancelled apt.   Some financial stress. He reports that he has worry related to paying bills. Denies panic.   Past Psychiatric Medication Trials: Abilify- Had manic s/s at 10 mg dose. Worsening depression at 15 mg po qd Kasandra Knudsen- has been most effective med for him but current insurance offers limited coverage.  Trileptal Lamictal Buspar-Ineffective Wellbutrin XL Naltrexone Trazodone   AIMS    Flowsheet Row Office Visit from 04/12/2022 in [facility] Visit from 09/10/2019 in [facility] Office Visit from 05/01/2019 in [facility]  AIMS Total Score 0 0 0        Review of Systems:  Review of Systems  Constitutional:  Positive for fatigue.  Musculoskeletal:  Negative for gait problem.  Neurological:  Negative for tremors.  Psychiatric/Behavioral:         Please refer to HPI    Medications: I have reviewed the patient's current medications.  Current Outpatient Medications  Medication Sig Dispense Refill   ARIPiprazole (ABILIFY) 10 MG tablet Take 1 tablet (10 mg total) by mouth daily. 90 tablet 2   cetirizine (ZYRTEC) 10 MG tablet Take 10 mg by mouth daily.     lamoTRIgine (LAMICTAL) 150 MG tablet Take 1 tablet (150 mg total) by mouth daily. 30 tablet 0   losartan-hydrochlorothiazide (HYZAAR) 100-25 MG tablet Take 1 tablet by mouth daily.     metFORMIN (GLUCOPHAGE-XR) 500 MG 24 hr tablet Take 500 mg by mouth daily with breakfast.     naltrexone (DEPADE) 50 MG tablet Take 50 mg by mouth daily.     Oxcarbazepine (TRILEPTAL) 300 MG tablet Take 3 tablets (900 mg total) by mouth at bedtime. 270 tablet 2   pantoprazole (PROTONIX) 40 MG tablet Take 1 tablet (40 mg total) by mouth daily. 90 tablet 3   No current facility-administered medications for this visit.    Medication Side Effects: None  Allergies: No Known Allergies  Past Medical History:  Diagnosis Date   Allergy    Anxiety    Depression    GERD (gastroesophageal reflux  disease)    Hypertension    Prediabetes     Past Medical History, Surgical history, Social history, and Family history were reviewed and updated as appropriate.   Please see review of systems for further details on the patient's review from today.   Objective:   Physical Exam:  There were no vitals taken for this visit.  Physical Exam  Lab Review:     Component Value Date/Time   NA 139 12/03/2014 1829   K 4.2 12/03/2014 1829   CL 103 12/03/2014 1829   CO2 25 12/03/2014 1829   GLUCOSE 96 12/03/2014 1829   BUN 15 12/03/2014 1829    CREATININE 1.05 12/03/2014 1829   CALCIUM 10.0 12/03/2014 1829   PROT 7.9 12/03/2014 1829   ALBUMIN 4.7 12/03/2014 1829   AST 37 12/03/2014 1829   ALT 50 12/03/2014 1829   ALKPHOS 96 12/03/2014 1829   BILITOT 0.6 12/03/2014 1829   GFRNONAA >90 12/03/2014 1829   GFRAA >90 12/03/2014 1829       Component Value Date/Time   WBC 12.4 (H) 12/03/2014 1829   RBC 5.80 12/03/2014 1829   HGB 18.6 (H) 12/03/2014 1829   HCT 52.9 (H) 12/03/2014 1829   PLT 215 12/03/2014 1829   MCV 91.2 12/03/2014 1829   MCH 32.1 12/03/2014 1829   MCHC 35.2 12/03/2014 1829   RDW 13.2 12/03/2014 1829   LYMPHSABS 3.6 12/03/2014 1829   MONOABS 0.9 12/03/2014 1829   EOSABS 0.2 12/03/2014 1829   BASOSABS 0.1 12/03/2014 1829    No results found for: "POCLITH", "LITHIUM"   No results found for: "PHENYTOIN", "PHENOBARB", "VALPROATE", "CBMZ"   .res Assessment: Plan:   Discussed sleep apnea as possible cause for physical symptoms.    There are no diagnoses linked to this encounter.   Please see After Visit Summary for patient specific instructions.  No future appointments.  No orders of the defined types were placed in this encounter.   -------------------------------

## 2023-01-25 ENCOUNTER — Telehealth: Payer: Self-pay | Admitting: Psychiatry

## 2023-01-25 NOTE — Telephone Encounter (Signed)
Next appt is 07/12/23. Hector Jackson, wife states that [name] requires prior authorization because the cost is over the dollar amount. Pharmacy is:  [facility] - [place], [place] - [address]   Phone: [phone]  Fax: [phone]    Their phone number is [phone].

## 2023-01-25 NOTE — Telephone Encounter (Signed)
[facility] sent PA request for Vraylar 1.5mg , see CMM

## 2023-01-28 NOTE — Telephone Encounter (Signed)
Prior Authorization initiated with [facility] for VRAYLAR 1.5 MG #90/90 day through [facility]

## 2023-03-15 ENCOUNTER — Telehealth: Payer: Self-pay | Admitting: Psychiatry

## 2023-03-15 DIAGNOSIS — F3131 Bipolar disorder, current episode depressed, mild: Secondary | ICD-10-CM

## 2023-03-15 MED ORDER — LAMOTRIGINE 150 MG PO TABS
150.0000 mg | ORAL_TABLET | Freq: Every day | ORAL | 0 refills | Status: DC
Start: 2023-03-15 — End: 2023-06-03

## 2023-03-15 MED ORDER — OXCARBAZEPINE 300 MG PO TABS
900.0000 mg | ORAL_TABLET | Freq: Every day | ORAL | 0 refills | Status: DC
Start: 2023-03-15 — End: 2023-06-03

## 2023-03-15 MED ORDER — CARIPRAZINE HCL 1.5 MG PO CAPS: 1.5000 mg | ORAL_CAPSULE | Freq: Every day | ORAL | 0 refills | Status: DC

## 2023-03-15 NOTE — Telephone Encounter (Signed)
Sent!

## 2023-03-15 NOTE — Telephone Encounter (Signed)
Pt called reporting new pharmacy [facility] mail order for 90 day going forward.. Not using [facility] any longer. Vraylar 1.5 mg cap is due in few days. Sent new Rx for Vraylar and all meds Vraylar, Lamotrigine & Oxcarbazepine. Canc all Rx at [facility]. Pt stated [facility] has PA for Vraylar already for 1 yr. Contact pt [phone] if needed.

## 2023-03-17 NOTE — Telephone Encounter (Signed)
[id number]. VRAYLAR CAP 1.5MG  is approved through 02/09/2024.

## 2023-03-17 NOTE — Telephone Encounter (Signed)
Vraylar 1.5 mg approved through 02/09/2024, [place] [id number]

## 2023-05-19 ENCOUNTER — Telehealth: Payer: Self-pay

## 2023-05-19 NOTE — Telephone Encounter (Signed)
Pt phoned and advised on 05/16/2023 he was diagnosed with Salmonella (another Dr did test) but he is calling us wanting to know what to do. Please advise

## 2023-05-23 ENCOUNTER — Other Ambulatory Visit: Payer: Self-pay | Admitting: Psychiatry

## 2023-05-24 ENCOUNTER — Encounter: Payer: Self-pay | Admitting: Internal Medicine

## 2023-06-03 ENCOUNTER — Other Ambulatory Visit: Payer: Self-pay | Admitting: Psychiatry

## 2023-06-03 DIAGNOSIS — F3131 Bipolar disorder, current episode depressed, mild: Secondary | ICD-10-CM

## 2023-07-12 ENCOUNTER — Ambulatory Visit: Payer: No Typology Code available for payment source | Admitting: Psychiatry

## 2023-08-01 ENCOUNTER — Encounter: Payer: Self-pay | Admitting: Psychiatry

## 2023-08-01 ENCOUNTER — Ambulatory Visit: Payer: No Typology Code available for payment source | Admitting: Psychiatry

## 2023-08-01 DIAGNOSIS — F3131 Bipolar disorder, current episode depressed, mild: Secondary | ICD-10-CM

## 2023-08-01 MED ORDER — CARIPRAZINE HCL 1.5 MG PO CAPS
1.5000 mg | ORAL_CAPSULE | Freq: Every day | ORAL | 3 refills | Status: AC
Start: 2023-08-01 — End: ?

## 2023-08-01 MED ORDER — OXCARBAZEPINE 300 MG PO TABS
900.0000 mg | ORAL_TABLET | Freq: Every day | ORAL | 3 refills | Status: AC
Start: 2023-08-01 — End: ?

## 2023-08-01 MED ORDER — LAMOTRIGINE 150 MG PO TABS
150.0000 mg | ORAL_TABLET | Freq: Every day | ORAL | 3 refills | Status: AC
Start: 2023-08-01 — End: ?

## 2023-08-01 NOTE — Progress Notes (Signed)
Hector Jackson [id number] February 26, 1981 42 y.o.  Subjective:   Patient ID:  Hector Jackson is a 42 y.o. (DOB Jul 05, 1981) male.  Chief Complaint:  Chief Complaint  Patient presents with   Other    Gambling   Follow-up    Bipolar Disorder and anxiety    HPI Hector Jackson presents to the office today for follow-up of mood disturbance, anxiety, and sleep disturbance.  He reports that he feels "tired all the time, just sleepy." He reports that his sleep is fragmented with multiple awakenings a night and some early morning awakenings. He reports sleeping well from about 8 pm- midnight and then sleep is disrupted for the rest of the night.   He reports that his mood has been "alright." Denies depressed mood. He reports that he has periods of elevated moods. He reports that he has been gambling some since sports bidding became legal. Denies any recent stock trading. Denies any other risky or impulsive behavior. Denies elevated mood- "I just feel even all the time." He reports occasional mild irritability. He reports that he rarely has increased energy. He reports, "if I'm really busy one day, I'm really tired the next day." Motivation is low. He reports poor concentration. He reports that he has to take a test for work and has had difficulty studying for this- "hard to stay focused." He reports procrastination. He reports some diminished interest in things. Denies SI.   Work has been "fine." Denies missing work  due to depression.   He reports that he has been off work for about a week and ETOH has been more during break from work. He reports that ETOH use has not changed- "I just want to sit on the back porch and do my own thing."  He reports that he increased Vraylar to 3 mg on his own and felt "scatterbrained, couldn't concentrate on anything" and went back to 1.5 mg daily.   Daughter is getting ready to turn 48.   Past Psychiatric Medication Trials: Abilify- Had manic s/s at 10 mg dose.  Worsening depression at 15 mg po every day Romeo Apple- has been most effective med for him but current insurance offers limited coverage.  Trileptal Lamictal Buspar-Ineffective Wellbutrin XL Naltrexone Trazodone  AIMS    Flowsheet Row Office Visit from 08/01/2023 in [facility] Office Visit from 01/17/2023 in [facility] Office Visit from 04/12/2022 in [facility] Office Visit from 09/10/2019 in [facility] Office Visit from 05/01/2019 in [facility]  AIMS Total Score 0 0 0 0 0        Review of Systems:  Review of Systems  Musculoskeletal:  Positive for back pain. Negative for gait problem.       Knee pain  Neurological:  Negative for tremors.  Psychiatric/Behavioral:         Please refer to HPI  Reports medical provider would like for him to repeat sleep study and has previously recommended cPap.  He reports that he had Salmonella a couple of months ago and was out of work.    Medications: I have reviewed the patient's current medications.  Current Outpatient Medications  Medication Sig Dispense Refill   cetirizine (ZYRTEC) 10 MG tablet Take 10 mg by mouth daily.     losartan-hydrochlorothiazide (HYZAAR) 100-25 MG tablet Take 1 tablet by mouth daily.     pantoprazole (PROTONIX) 40 MG tablet Take 1 tablet (40  mg total) by mouth daily. 90 tablet 3   cariprazine (VRAYLAR) 1.5 MG capsule Take 1 capsule (1.5 mg total) by mouth daily. 90 capsule 3   lamoTRIgine (LAMICTAL) 150 MG tablet Take 1 tablet (150 mg total) by mouth daily. 90 tablet 3   Oxcarbazepine (TRILEPTAL) 300 MG tablet Take 3 tablets (900 mg total) by mouth at bedtime. 270 tablet 3   No current facility-administered medications for this visit.    Medication Side Effects: None  Allergies: No Known Allergies  Past Medical History:  Diagnosis Date   Allergy    Anxiety     Depression    GERD (gastroesophageal reflux disease)    Hypertension    Prediabetes     Past Medical History, Surgical history, Social history, and Family history were reviewed and updated as appropriate.   Please see review of systems for further details on the patient's review from today.   Objective:   Physical Exam:  Wt (!) 335 lb (152 kg)   BMI 46.72 kg/m   Physical Exam Constitutional:      General: He is not in acute distress. Musculoskeletal:        General: No deformity.  Neurological:     Mental Status: He is alert and oriented to person, place, and time.     Coordination: Coordination normal.  Psychiatric:        Attention and Perception: Attention and perception normal. He does not perceive auditory or visual hallucinations.        Mood and Affect: Mood is depressed. Mood is not anxious. Affect is not labile, blunt, angry or inappropriate.        Speech: Speech normal.        Behavior: Behavior normal.        Thought Content: Thought content normal. Thought content is not paranoid or delusional. Thought content does not include homicidal or suicidal ideation. Thought content does not include homicidal or suicidal plan.        Cognition and Memory: Cognition and memory normal.     Comments: Insight intact Judgment intact with the exception of reported excessive gambling.     Lab Review:     Component Value Date/Time   NA 139 12/03/2014 1829   K 4.2 12/03/2014 1829   CL 103 12/03/2014 1829   CO2 25 12/03/2014 1829   GLUCOSE 96 12/03/2014 1829   BUN 15 12/03/2014 1829   CREATININE 1.05 12/03/2014 1829   CALCIUM 10.0 12/03/2014 1829   PROT 7.9 12/03/2014 1829   ALBUMIN 4.7 12/03/2014 1829   AST 37 12/03/2014 1829   ALT 50 12/03/2014 1829   ALKPHOS 96 12/03/2014 1829   BILITOT 0.6 12/03/2014 1829   GFRNONAA >90 12/03/2014 1829   GFRAA >90 12/03/2014 1829       Component Value Date/Time   WBC 12.4 (H) 12/03/2014 1829   RBC 5.80 12/03/2014 1829    HGB 18.6 (H) 12/03/2014 1829   HCT 52.9 (H) 12/03/2014 1829   PLT 215 12/03/2014 1829   MCV 91.2 12/03/2014 1829   MCH 32.1 12/03/2014 1829   MCHC 35.2 12/03/2014 1829   RDW 13.2 12/03/2014 1829   LYMPHSABS 3.6 12/03/2014 1829   MONOABS 0.9 12/03/2014 1829   EOSABS 0.2 12/03/2014 1829   BASOSABS 0.1 12/03/2014 1829    No results found for: "POCLITH", "LITHIUM"   No results found for: "PHENYTOIN", "PHENOBARB", "VALPROATE", "CBMZ"   .res Assessment: Plan:    29 minutes spent dedicated to the care of  this patient on the date of this encounter to include pre-visit review of records, ordering of medication, post visit documentation, and face-to-face time with the patient discussing recent gambling. Discussed that impulsive behaviors can be a rare side effect of partial dopamine agonists. 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 He reports that gambling started when sports gambling became legal in [place]. He reports that he would like to continue Vraylar 1.5 mg daily. Advised pt to contact office if gambling worsens or if he suspects that this is related to a medication side effect. Pt verbalized understanding.  Will continue Lamictal 150 mg daily for mood symptoms.  Continue Trileptal 900 mg at bedtime for mood stabilization and anxiety.  Pt to follow-up in 6-9 months or sooner if clinically indicated.  Patient advised to contact office with any questions, adverse effects, or acute worsening in signs and symptoms.   Isabella was seen today for other and follow-up.  Diagnoses and all orders for this visit:  Bipolar affective disorder, currently depressed, mild (HCC) -     cariprazine (VRAYLAR) 1.5 MG capsule; Take 1 capsule (1.5 mg total) by mouth daily. -     lamoTRIgine (LAMICTAL) 150 MG tablet; Take 1 tablet (150 mg total) by mouth daily. -     Oxcarbazepine (TRILEPTAL) 300 MG tablet; Take 3  tablets (900 mg total) by mouth at bedtime.     Please see After Visit Summary for patient specific instructions.  No future appointments.   No orders of the defined types were placed in this encounter.   -------------------------------

## 2023-08-10 ENCOUNTER — Encounter: Payer: Self-pay | Admitting: Psychiatry

## 2023-10-13 ENCOUNTER — Encounter: Payer: Self-pay | Admitting: Internal Medicine

## 2023-10-13 ENCOUNTER — Ambulatory Visit (INDEPENDENT_AMBULATORY_CARE_PROVIDER_SITE_OTHER): Payer: No Typology Code available for payment source | Admitting: Internal Medicine

## 2023-10-13 VITALS — BP 138/89 | HR 102 | Temp 98.5°F | Ht 71.0 in | Wt 339.2 lb

## 2023-10-13 DIAGNOSIS — R7989 Other specified abnormal findings of blood chemistry: Secondary | ICD-10-CM | POA: Diagnosis not present

## 2023-10-13 DIAGNOSIS — K219 Gastro-esophageal reflux disease without esophagitis: Secondary | ICD-10-CM

## 2023-10-13 DIAGNOSIS — F1090 Alcohol use, unspecified, uncomplicated: Secondary | ICD-10-CM

## 2023-10-13 DIAGNOSIS — K7581 Nonalcoholic steatohepatitis (NASH): Secondary | ICD-10-CM

## 2023-10-13 NOTE — Progress Notes (Signed)
Primary Care Physician:  Donetta Potts, MD Primary Gastroenterologist:  Dr. Marletta Lor  Chief Complaint  Patient presents with   New Patient (Initial Visit)    Referred for elevated liver enzymes     HPI:   Hector Jackson is a 43 y.o. male who presents to clinic today for follow up visit.    Metabolic dysfunction associated steatohepatitis: Initially seen in April 2023 for abnormal liver tests.  Blood work:  11/02/2022 AST 72, ALT 124 platelets 241, FIB4 1.07 11/11/2021 AST 42, ALT 77 platelets 270, FIB4 0.71  Ultrasound 12/02/2021 : hepatomegaly and fatty infiltration of the liver.  Otherwise unremarkable.  Risk factors for fatty liver disease include obesity and diabetes.  Also notes alcohol use though has cut back a lot. Only drinking beer 1-2 times a month.   Weight currently 339 pounds.  No family history of liver disease.  States he was tested for hepatitis B and C and HIV which were negative. No illicit drug use.  Admitted to [facility] 09/30/2023 with influenza, pneumonia, sepsis, completed course of Tamiflu as well as antibiotics for pneumonia.  During hospitalization had increased liver enzymes.  On day of discharge 10/02/2023, AST 248, ALT 241, alk phos 84, T. bili 0.3, platelets 132.  CT abdomen pelvis with IV contrast 09/30/2023 hepatomegaly with steatosis fat-containing umbilical hernia.  No focal lesion, gallbladder unremarkable.  No biliary dilatation.  Chronic GERD: Well-controlled on pantoprazole 40 mg daily  Today, states he is doing well.  Recovering from recent hospitalization.    Past Medical History:  Diagnosis Date   Allergy    Anxiety    Depression    GERD (gastroesophageal reflux disease)    Hypertension    Prediabetes     No past surgical history on file.  Current Outpatient Medications  Medication Sig Dispense Refill   cariprazine (VRAYLAR) 1.5 MG capsule Take 1 capsule (1.5 mg total) by mouth daily. 90 capsule 3   cetirizine  (ZYRTEC) 10 MG tablet Take 10 mg by mouth daily.     lamoTRIgine (LAMICTAL) 150 MG tablet Take 1 tablet (150 mg total) by mouth daily. 90 tablet 3   losartan-hydrochlorothiazide (HYZAAR) 100-25 MG tablet Take 1 tablet by mouth daily.     Oxcarbazepine (TRILEPTAL) 300 MG tablet Take 3 tablets (900 mg total) by mouth at bedtime. 270 tablet 3   pantoprazole (PROTONIX) 40 MG tablet Take 1 tablet (40 mg total) by mouth daily. 90 tablet 3   No current facility-administered medications for this visit.    Allergies as of 10/13/2023   (No Known Allergies)    Family History  Problem Relation Age of Onset   Epilepsy Other    Diabetes Other     Social History   Socioeconomic History   Marital status: Married    Spouse name: Not on file   Number of children: Not on file   Years of education: Not on file   Highest education level: Not on file  Occupational History   Not on file  Tobacco Use   Smoking status: Former    Current packs/day: 0.00    Types: Cigarettes    Quit date: 06/22/2016    Years since quitting: 7.3   Smokeless tobacco: Current  Vaping Use   Vaping status: Every Day  Substance and Sexual Activity   Alcohol use: Not Currently    Comment: 10-15 beers about 3-4 times a week   Drug use: No   Sexual activity: Not on  file  Other Topics Concern   Not on file  Social History Narrative   Not on file   Social Drivers of Health   Financial Resource Strain: High Risk (10/01/2023)   Received from [facility]   Overall Financial Resource Strain (CARDIA)    Difficulty of Paying Living Expenses: Hard  Food Insecurity: No Food Insecurity (10/01/2023)   Received from [facility]   Hunger Vital Sign    Worried About Running Out of Food in the Last Year: Never true    Ran Out of Food in the Last Year: Never true  Transportation Needs: No Transportation Needs (10/01/2023)   Received from [facility] - Transportation    Lack of Transportation (Medical): No     Lack of Transportation (Non-Medical): No  Physical Activity: Inactive (10/01/2023)   Received from [facility]   Exercise Vital Sign    Days of Exercise per Week: 0 days    Minutes of Exercise per Session: 0 min  Stress: Stress Concern Present (10/01/2023)   Received from [facility] - Occupational Stress Questionnaire    Feeling of Stress : Rather much  Social Connections: Moderately Isolated (10/01/2023)   Received from [facility]   Social Connection and Isolation Panel [NHANES]    Frequency of Communication with Friends and Family: More than three times a week    Frequency of Social Gatherings with Friends and Family: More than three times a week    Attends Religious Services: Never    Database administrator or Organizations: No    Attends Banker Meetings: Never    Marital Status: Married  Catering manager Violence: Not At Risk (10/01/2023)   Received from [facility]   Humiliation, Afraid, Rape, and Kick questionnaire    Fear of Current or Ex-Partner: No    Emotionally Abused: No    Physically Abused: No    Sexually Abused: No    Subjective: Review of Systems  Constitutional:  Negative for chills and fever.  HENT:  Negative for congestion and hearing loss.   Eyes:  Negative for blurred vision and double vision.  Respiratory:  Negative for cough and shortness of breath.   Cardiovascular:  Negative for chest pain and palpitations.  Gastrointestinal:  Positive for heartburn. Negative for abdominal pain, blood in stool, constipation, diarrhea, melena and vomiting.  Genitourinary:  Negative for dysuria and urgency.  Musculoskeletal:  Negative for joint pain and myalgias.  Skin:  Negative for itching and rash.  Neurological:  Negative for dizziness and headaches.  Psychiatric/Behavioral:  Negative for depression. The patient is not nervous/anxious.        Objective: BP 138/89   Pulse (!) 102   Temp 98.5 F  (36.9 C)   Ht 5\' 11"  (1.803 m)   Wt (!) 339 lb 3.2 oz (153.9 kg)   BMI 47.31 kg/m  Physical Exam Constitutional:      Appearance: Normal appearance. He is obese.  HENT:     Head: Normocephalic and atraumatic.  Eyes:     Extraocular Movements: Extraocular movements intact.     Conjunctiva/sclera: Conjunctivae normal.  Cardiovascular:     Rate and Rhythm: Normal rate and regular rhythm.  Pulmonary:     Effort: Pulmonary effort is normal.     Breath sounds: Normal breath sounds.  Abdominal:     General: Bowel sounds are normal.     Palpations:  Abdomen is soft.  Musculoskeletal:        General: Normal range of motion.     Cervical back: Normal range of motion and neck supple.  Skin:    General: Skin is warm.  Neurological:     General: No focal deficit present.     Mental Status: He is alert and oriented to person, place, and time.  Psychiatric:        Mood and Affect: Mood normal.        Behavior: Behavior normal.      Assessment/Plan:  1.  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  Risk factors include morbid obesity, hypertension, DM2.  Recent increase in LFTs during hospitalization likely consequence of sepsis, influenza.  Recheck tomorrow through PCP.  I will add on INR.  Follow-up with me in 6 to 8 weeks and we will continue to monitor, I imagine these will slowly decrease back to baseline.  If not, we may need to perform further serological workup.  Recommend 1-2# weight loss per week until ideal body weight through exercise & diet. Low fat/cholesterol diet.   Avoid sweets, sodas, fruit juices, sweetened beverages like tea, etc. Gradually increase exercise from 15 min daily up to 1 hr per day 5 days/week. Limit alcohol use.  Given new diagnosis of DM2, he may be a good candidate for GLP-1. I reviewed with patient that there is data indicating a benefit of using GLP-1 inhibitors in the setting  of fatty liver disease: Previous studies have shown that semaglutide was associated with improvement in body weight and glycemic control in patients with obesity and type 2 diabetes mellitus. Semaglutide was also associated with reduced rate of cardiovascular death, nonfatal myocardial infarction, or nonfatal stroke in patients with type 2 diabetes mellitus. Furthermore, semaglutide significantly reduced levels of alanine aminotransferase and markers of inflammation. A phase 2 trial involving patients with NASH showed that treatment with semaglutide resulted in a significantly higher percentage of patients with NASH resolution than placebo.  Gayleen Orem, AJ. Treatments for obesity in the context of nonalcoholic steatohepatitis and mental health. Clin. Liver Dis. 2022; 20: 48- 51. [email or web address]  2.  Chronic GERD-well-controlled pantoprazole, will continue.  Follow-up in 6 to 8 weeks    10/13/2023 9:07 AM   Disclaimer: This note was dictated with voice recognition software. Similar sounding words can inadvertently be transcribed and may not be corrected upon review.

## 2023-10-13 NOTE — Patient Instructions (Signed)
Likely recent worsening of your liver function tests due to influenza.  I am going to add on an INR to check with your repeat blood work tomorrow.  Follow-up with me in 6 to 8 weeks and we will continue to monitor.  If they do not decrease back down to baseline we may need to perform further workup.  Recommend 1-2# weight loss per week until ideal body weight through exercise & diet. Low fat/cholesterol diet.   Avoid sweets, sodas, fruit juices, sweetened beverages like tea, etc. Gradually increase exercise from 15 min daily up to 1 hr per day 5 days/week. Limit alcohol use.  It was nice seeing you again today.  Dr. Marletta Lor

## 2023-11-30 ENCOUNTER — Ambulatory Visit: Payer: No Typology Code available for payment source | Admitting: Internal Medicine

## 2023-11-30 ENCOUNTER — Encounter: Payer: Self-pay | Admitting: Internal Medicine

## 2023-11-30 VITALS — BP 126/77 | HR 105 | Temp 97.2°F | Ht 71.0 in | Wt 311.0 lb

## 2023-11-30 DIAGNOSIS — F1091 Alcohol use, unspecified, in remission: Secondary | ICD-10-CM | POA: Diagnosis not present

## 2023-11-30 DIAGNOSIS — K7581 Nonalcoholic steatohepatitis (NASH): Secondary | ICD-10-CM | POA: Diagnosis not present

## 2023-11-30 DIAGNOSIS — K219 Gastro-esophageal reflux disease without esophagitis: Secondary | ICD-10-CM | POA: Diagnosis not present

## 2023-11-30 DIAGNOSIS — R7989 Other specified abnormal findings of blood chemistry: Secondary | ICD-10-CM

## 2023-11-30 NOTE — Patient Instructions (Signed)
 Your most recent blood work showed almost normalization of your liver test.  I am encouraged by this.  Congratulations on your weight loss, that is fantastic, continue to work on losing weight, continue GLP-1 (Ozempic).  Recommend 1-2# weight loss per week until ideal body weight through exercise & diet. Low fat/cholesterol diet.   Avoid sweets, sodas, fruit juices, sweetened beverages like tea, etc. Gradually increase exercise from 15 min daily up to 1 hr per day 5 days/week. Limit alcohol use.  Follow up in 4 months  It was nice seeing you again today.  Dr. Marletta Lor

## 2023-11-30 NOTE — Progress Notes (Signed)
 Primary Care Physician:  Donetta Potts, MD Primary Gastroenterologist:  Dr. Marletta Lor  Chief Complaint  Patient presents with   Gastroesophageal Reflux    Follow up on GERD. Takes protonix daily. Working well.     HPI:   Hector Jackson is a 43 y.o. male who presents to clinic today for follow up visit.    Metabolic dysfunction associated steatohepatitis: Initially seen in April 2023 for abnormal liver tests.  Blood work:  11/02/2022 AST 72, ALT 124 platelets 241, FIB4 1.07 11/11/2021 AST 42, ALT 77 platelets 270, FIB4 0.71  Ultrasound 12/02/2021 : hepatomegaly and fatty infiltration of the liver.  Otherwise unremarkable.  Risk factors for fatty liver disease include obesity and diabetes.  Also notes alcohol use though not had any alcohol in 2 months, has cut back significantly.  Weight currently 311 pounds down form 339.  Started on GLP-1 which has helped with this.  No family history of liver disease.  States he was tested for hepatitis B and C and HIV which were negative. No illicit drug use.  Admitted to [facility] 09/30/2023 with influenza, pneumonia, sepsis, completed course of Tamiflu as well as antibiotics for pneumonia.  During hospitalization had increased liver enzymes.  On day of discharge 10/02/2023, AST 248, ALT 241, alk phos 84, T. bili 0.3, platelets 132.  CT abdomen pelvis with IV contrast 09/30/2023 hepatomegaly with steatosis fat-containing umbilical hernia.  No focal lesion, gallbladder unremarkable.  No biliary dilatation.  Follow-up blood work performed through PCP though I cannot see results. Patient able to pull up on his cell phone.   Blood work 11/11/23: AST 39 ALT 55 T bili 0.6 Alk phos 79.   Chronic GERD: Well-controlled on pantoprazole 40 mg daily  Today, states he is doing well.  No complaints.     Past Medical History:  Diagnosis Date   Allergy    Anxiety    Depression    GERD (gastroesophageal reflux disease)    Hypertension     Prediabetes     No past surgical history on file.  Current Outpatient Medications  Medication Sig Dispense Refill   cariprazine (VRAYLAR) 1.5 MG capsule Take 1 capsule (1.5 mg total) by mouth daily. 90 capsule 3   cetirizine (ZYRTEC) 10 MG tablet Take 10 mg by mouth daily.     lamoTRIgine (LAMICTAL) 150 MG tablet Take 1 tablet (150 mg total) by mouth daily. 90 tablet 3   losartan-hydrochlorothiazide (HYZAAR) 100-25 MG tablet Take 1 tablet by mouth daily.     Oxcarbazepine (TRILEPTAL) 300 MG tablet Take 3 tablets (900 mg total) by mouth at bedtime. 270 tablet 3   pantoprazole (PROTONIX) 40 MG tablet Take 1 tablet (40 mg total) by mouth daily. 90 tablet 3   Semaglutide (OZEMPIC, 0.25 OR 0.5 MG/DOSE, [place]) Inject into the skin. Once weekly     No current facility-administered medications for this visit.    Allergies as of 11/30/2023   (No Known Allergies)    Family History  Problem Relation Age of Onset   Epilepsy Other    Diabetes Other     Social History   Socioeconomic History   Marital status: Married    Spouse name: Not on file   Number of children: Not on file   Years of education: Not on file   Highest education level: Not on file  Occupational History   Not on file  Tobacco Use   Smoking status: Every Day    Current packs/day:  0.00    Types: Cigarettes    Last attempt to quit: 06/22/2016    Years since quitting: 7.4    Passive exposure: Current   Smokeless tobacco: Current  Vaping Use   Vaping status: Every Day  Substance and Sexual Activity   Alcohol use: Not Currently    Comment: 10-15 beers about 3-4 times a week   Drug use: No   Sexual activity: Not on file  Other Topics Concern   Not on file  Social History Narrative   Not on file   Social Drivers of Health   Financial Resource Strain: High Risk (10/01/2023)   Received from [facility]   Overall Financial Resource Strain (CARDIA)    Difficulty of Paying Living Expenses: Hard  Food Insecurity:  No Food Insecurity (10/01/2023)   Received from [facility]   Hunger Vital Sign    Worried About Running Out of Food in the Last Year: Never true    Ran Out of Food in the Last Year: Never true  Transportation Needs: No Transportation Needs (10/01/2023)   Received from [facility] - Transportation    Lack of Transportation (Medical): No    Lack of Transportation (Non-Medical): No  Physical Activity: Inactive (10/01/2023)   Received from [facility]   Exercise Vital Sign    Days of Exercise per Week: 0 days    Minutes of Exercise per Session: 0 min  Stress: Stress Concern Present (10/01/2023)   Received from [facility] - Occupational Stress Questionnaire    Feeling of Stress : Rather much  Social Connections: Moderately Isolated (10/01/2023)   Received from [facility]   Social Connection and Isolation Panel [NHANES]    Frequency of Communication with Friends and Family: More than three times a week    Frequency of Social Gatherings with Friends and Family: More than three times a week    Attends Religious Services: Never    Database administrator or Organizations: No    Attends Banker Meetings: Never    Marital Status: Married  Catering manager Violence: Not At Risk (10/01/2023)   Received from [facility]   Humiliation, Afraid, Rape, and Kick questionnaire    Fear of Current or Ex-Partner: No    Emotionally Abused: No    Physically Abused: No    Sexually Abused: No    Subjective: Review of Systems  Constitutional:  Negative for chills and fever.  HENT:  Negative for congestion and hearing loss.   Eyes:  Negative for blurred vision and double vision.  Respiratory:  Negative for cough and shortness of breath.   Cardiovascular:  Negative for chest pain and palpitations.  Gastrointestinal:  Positive for heartburn. Negative for abdominal pain, blood in stool, constipation, diarrhea, melena and  vomiting.  Genitourinary:  Negative for dysuria and urgency.  Musculoskeletal:  Negative for joint pain and myalgias.  Skin:  Negative for itching and rash.  Neurological:  Negative for dizziness and headaches.  Psychiatric/Behavioral:  Negative for depression. The patient is not nervous/anxious.        Objective: BP 126/77   Pulse (!) 105   Temp (!) 97.2 F (36.2 C)   Ht 5\' 11"  (1.803 m)   Wt (!) 311 lb (141.1 kg)   BMI 43.38 kg/m  Physical Exam Constitutional:      Appearance: Normal appearance. He is obese.  HENT:  Head: Normocephalic and atraumatic.  Eyes:     Extraocular Movements: Extraocular movements intact.     Conjunctiva/sclera: Conjunctivae normal.  Cardiovascular:     Rate and Rhythm: Normal rate and regular rhythm.  Pulmonary:     Effort: Pulmonary effort is normal.     Breath sounds: Normal breath sounds.  Abdominal:     General: Bowel sounds are normal.     Palpations: Abdomen is soft.  Musculoskeletal:        General: Normal range of motion.     Cervical back: Normal range of motion and neck supple.  Skin:    General: Skin is warm.  Neurological:     General: No focal deficit present.     Mental Status: He is alert and oriented to person, place, and time.  Psychiatric:        Mood and Affect: Mood normal.        Behavior: Behavior normal.      Assessment/Plan:  1.  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  Risk factors include morbid obesity, hypertension, DM2.  Recent increase in LFTs during hospitalization likely consequence of sepsis, influenza.  These have already almost normalized.   Blood work 11/11/23: AST 39 ALT 55 T bili 0.6 Alk phos 79. Fib4 of 0.84  Started on GLP-1 and has lost 30 pounds.  Congratulated him on this.  Recommend 1-2# weight loss per week until ideal body weight through exercise & diet. Low fat/cholesterol diet.   Avoid sweets, sodas,  fruit juices, sweetened beverages like tea, etc. Gradually increase exercise from 15 min daily up to 1 hr per day 5 days/week. Limit alcohol use.  2.  Chronic GERD-well-controlled pantoprazole, will continue.  Follow-up in 4 months with repeat blood work.    11/30/2023 11:54 AM   Disclaimer: This note was dictated with voice recognition software. Similar sounding words can inadvertently be transcribed and may not be corrected upon review.

## 2024-03-20 ENCOUNTER — Encounter: Payer: Self-pay | Admitting: Internal Medicine

## 2024-10-28 ENCOUNTER — Other Ambulatory Visit: Payer: Self-pay | Admitting: Internal Medicine

## 2024-10-30 NOTE — Telephone Encounter (Signed)
 Hector Jackson, Please call and schedule pt for an appt before further refills.

## 2024-10-30 NOTE — Telephone Encounter (Signed)
 noted

## 2024-11-02 ENCOUNTER — Encounter (INDEPENDENT_AMBULATORY_CARE_PROVIDER_SITE_OTHER): Payer: Self-pay | Admitting: Internal Medicine

## 2024-11-02 NOTE — Telephone Encounter (Signed)
 noted
